# Patient Record
Sex: Female | Born: 1970 | Hispanic: No | Marital: Married | Smoking: Never smoker
Health system: Southern US, Community
[De-identification: ages and names within clinical notes are randomized; demographics above are authoritative.]

## PROBLEM LIST (undated history)

## (undated) DIAGNOSIS — F1910 Other psychoactive substance abuse, uncomplicated: Secondary | ICD-10-CM

## (undated) DIAGNOSIS — G4700 Insomnia, unspecified: Secondary | ICD-10-CM

## (undated) DIAGNOSIS — F419 Anxiety disorder, unspecified: Secondary | ICD-10-CM

## (undated) DIAGNOSIS — N943 Premenstrual tension syndrome: Secondary | ICD-10-CM

## (undated) DIAGNOSIS — K219 Gastro-esophageal reflux disease without esophagitis: Secondary | ICD-10-CM

## (undated) DIAGNOSIS — F329 Major depressive disorder, single episode, unspecified: Secondary | ICD-10-CM

## (undated) DIAGNOSIS — G43909 Migraine, unspecified, not intractable, without status migrainosus: Secondary | ICD-10-CM

## (undated) HISTORY — DX: Insomnia, unspecified: G47.00

## (undated) HISTORY — PX: BREAST SURGERY: SHX581

## (undated) HISTORY — DX: Gastro-esophageal reflux disease without esophagitis: K21.9

## (undated) HISTORY — DX: Anxiety disorder, unspecified: F41.9

## (undated) HISTORY — DX: Other psychoactive substance abuse, uncomplicated: F19.10

## (undated) HISTORY — DX: Migraine, unspecified, not intractable, without status migrainosus: G43.909

## (undated) HISTORY — DX: Premenstrual tension syndrome: N94.3

## (undated) HISTORY — DX: Major depressive disorder, single episode, unspecified: F32.9

---

## 2004-02-28 ENCOUNTER — Ambulatory Visit: Payer: Self-pay | Admitting: Internal Medicine

## 2004-03-20 ENCOUNTER — Ambulatory Visit: Payer: Self-pay | Admitting: Internal Medicine

## 2004-04-06 ENCOUNTER — Ambulatory Visit: Payer: Self-pay | Admitting: Family Medicine

## 2004-06-27 ENCOUNTER — Ambulatory Visit: Payer: Self-pay | Admitting: Internal Medicine

## 2005-09-05 ENCOUNTER — Ambulatory Visit: Payer: Self-pay | Admitting: Internal Medicine

## 2005-09-11 ENCOUNTER — Ambulatory Visit: Payer: Self-pay | Admitting: Internal Medicine

## 2005-10-29 ENCOUNTER — Ambulatory Visit: Payer: Self-pay | Admitting: Internal Medicine

## 2006-05-30 ENCOUNTER — Ambulatory Visit: Payer: Self-pay | Admitting: Internal Medicine

## 2006-07-31 ENCOUNTER — Other Ambulatory Visit: Admission: RE | Admit: 2006-07-31 | Discharge: 2006-07-31 | Payer: Self-pay | Admitting: Obstetrics & Gynecology

## 2006-11-20 DIAGNOSIS — J309 Allergic rhinitis, unspecified: Secondary | ICD-10-CM | POA: Insufficient documentation

## 2006-11-20 DIAGNOSIS — G4700 Insomnia, unspecified: Secondary | ICD-10-CM | POA: Insufficient documentation

## 2007-01-30 ENCOUNTER — Telehealth: Payer: Self-pay | Admitting: Internal Medicine

## 2007-07-27 ENCOUNTER — Telehealth: Payer: Self-pay | Admitting: Internal Medicine

## 2007-08-11 ENCOUNTER — Ambulatory Visit: Payer: Self-pay | Admitting: Internal Medicine

## 2007-08-11 LAB — CONVERTED CEMR LAB
AST: 19 units/L (ref 0–37)
Albumin: 3.9 g/dL (ref 3.5–5.2)
Alkaline Phosphatase: 32 units/L — ABNORMAL LOW (ref 39–117)
BUN: 8 mg/dL (ref 6–23)
Bilirubin Urine: NEGATIVE
Blood in Urine, dipstick: NEGATIVE
Eosinophils Relative: 7.6 % — ABNORMAL HIGH (ref 0.0–5.0)
GFR calc Af Amer: 105 mL/min
Glucose, Bld: 85 mg/dL (ref 70–99)
Glucose, Urine, Semiquant: NEGATIVE
HCT: 37.3 % (ref 36.0–46.0)
HDL: 69.1 mg/dL (ref >39.0)
Hemoglobin: 12.7 g/dL (ref 12.0–15.0)
Ketones, urine, test strip: NEGATIVE
Monocytes Absolute: 0.3 10*3/uL (ref 0.1–1.0)
Monocytes Relative: 7.4 % (ref 3.0–12.0)
Platelets: 234 10*3/uL (ref 150–400)
Potassium: 5 meq/L (ref 3.5–5.1)
Protein, U semiquant: NEGATIVE
RBC: 4.02 M/uL (ref 3.87–5.11)
Specific Gravity, Urine: 1.01
Total CHOL/HDL Ratio: 2.8
Total Protein: 6.5 g/dL (ref 6.0–8.3)
Urobilinogen, UA: 0.2
WBC: 4.3 10*3/uL — ABNORMAL LOW (ref 4.5–10.5)

## 2007-08-18 ENCOUNTER — Ambulatory Visit: Payer: Self-pay | Admitting: Internal Medicine

## 2007-08-18 DIAGNOSIS — N943 Premenstrual tension syndrome: Secondary | ICD-10-CM | POA: Insufficient documentation

## 2007-09-16 ENCOUNTER — Other Ambulatory Visit: Admission: RE | Admit: 2007-09-16 | Discharge: 2007-09-16 | Payer: Self-pay | Admitting: Obstetrics & Gynecology

## 2008-04-20 ENCOUNTER — Telehealth: Payer: Self-pay | Admitting: Internal Medicine

## 2008-06-14 ENCOUNTER — Telehealth: Payer: Self-pay | Admitting: Internal Medicine

## 2008-08-30 ENCOUNTER — Telehealth: Payer: Self-pay | Admitting: Internal Medicine

## 2008-11-14 ENCOUNTER — Ambulatory Visit: Payer: Self-pay | Admitting: Internal Medicine

## 2008-11-14 DIAGNOSIS — J029 Acute pharyngitis, unspecified: Secondary | ICD-10-CM

## 2008-11-15 ENCOUNTER — Encounter: Payer: Self-pay | Admitting: Internal Medicine

## 2009-04-11 ENCOUNTER — Ambulatory Visit: Payer: Self-pay | Admitting: Internal Medicine

## 2009-04-11 LAB — CONVERTED CEMR LAB
Albumin: 4.4 g/dL (ref 3.5–5.2)
BUN: 8 mg/dL (ref 6–23)
Basophils Relative: 1.1 % (ref 0.0–3.0)
Bilirubin Urine: NEGATIVE
Calcium: 9.3 mg/dL (ref 8.4–10.5)
Creatinine, Ser: 0.8 mg/dL (ref 0.4–1.2)
Direct LDL: 118.4 mg/dL
Eosinophils Absolute: 0.4 10*3/uL (ref 0.0–0.7)
GFR calc non Af Amer: 85.92 mL/min (ref >60)
Glucose, Bld: 84 mg/dL (ref 70–99)
HDL: 75 mg/dL (ref >39.00)
Lymphs Abs: 1.4 10*3/uL (ref 0.7–4.0)
MCHC: 33.7 g/dL (ref 30.0–36.0)
MCV: 95.6 fL (ref 78.0–100.0)
Monocytes Absolute: 0.4 10*3/uL (ref 0.1–1.0)
Neutrophils Relative %: 54 % (ref 43.0–77.0)
Platelets: 240 10*3/uL (ref 150.0–400.0)
TSH: 0.41 microintl units/mL (ref 0.35–5.50)
Triglycerides: 66 mg/dL (ref 0.0–149.0)
Urine Glucose: NEGATIVE mg/dL
Urobilinogen, UA: 0.2 (ref 0.0–1.0)

## 2009-04-25 ENCOUNTER — Ambulatory Visit: Payer: Self-pay | Admitting: Internal Medicine

## 2009-05-03 ENCOUNTER — Ambulatory Visit: Payer: Self-pay | Admitting: Internal Medicine

## 2009-05-03 DIAGNOSIS — J019 Acute sinusitis, unspecified: Secondary | ICD-10-CM

## 2009-05-03 DIAGNOSIS — J069 Acute upper respiratory infection, unspecified: Secondary | ICD-10-CM | POA: Insufficient documentation

## 2009-06-09 ENCOUNTER — Telehealth: Payer: Self-pay | Admitting: *Deleted

## 2009-06-27 ENCOUNTER — Telehealth: Payer: Self-pay | Admitting: *Deleted

## 2009-07-06 ENCOUNTER — Ambulatory Visit: Payer: Self-pay | Admitting: Internal Medicine

## 2009-07-06 DIAGNOSIS — F4323 Adjustment disorder with mixed anxiety and depressed mood: Secondary | ICD-10-CM

## 2010-05-22 NOTE — Progress Notes (Signed)
Summary: change from lexapro to paxil  Phone Note Call from Patient Call back at Work Phone [phone]   Caller: Patient Summary of Call: Pt called saying that lexapro is not working and would like to try paxil. Pt states that she is having some anxiety and depression isssues on lexapro. Pt does have an appt on 3/17. But would like to try paxil before then. Initial call taken by: Romualdo Bolk, CMA (AAMA),  June 27, 2009 11:25 AM  Follow-up for Phone Call        paxil 20 mg 1 by mouth once daily  and increase to 2 per day as needed.   disp 60   keep follow up appt.  Follow-up by: Madelin Headings MD,  June 27, 2009 12:59 PM  Additional Follow-up for Phone Call Additional follow up Details #1::        Left message on machine about rx being called in and to keep follow up appt on 3/17. Additional Follow-up by: Romualdo Bolk, CMA (AAMA),  June 27, 2009 1:22 PM    New/Updated Medications: PAROXETINE HCL 20 MG TABS (PAROXETINE HCL) 1 by mouth once daily and can increase to 2 by mouth once daily as needed. Prescriptions: PAROXETINE HCL 20 MG TABS (PAROXETINE HCL) 1 by mouth once daily and can increase to 2 by mouth once daily as needed.  #60 x 0   Entered by:   Romualdo Bolk, CMA (AAMA)   Authorized by:   Madelin Headings MD   Signed by:   Romualdo Bolk, CMA (AAMA) on 06/27/2009   Method used:   Electronically to        [facility]. [phone]* (retail)       [address]       [place], [place]  [address]       Ph: [phone]       Fax: [phone]   RxID:   [id number]

## 2010-05-22 NOTE — Assessment & Plan Note (Signed)
Summary: SITUATIONAL DEPRESSION / CONCERNS W/ MEDS // RS   Vital Signs:  Patient profile:   40 year old female Menstrual status:  regular Weight:      127 pounds Temp:     98.5 degrees F oral BP sitting:   110 / 70  Vitals Entered By: Lynann Beaver CMA (July 06, 2009 9:47 AM) CC: rov regarding meds Is Patient Diabetic? No Pain Assessment Patient in pain? no        History of Present Illness: Sharon Mercado  comes  in as directed after starting  paxil for reactive depressiona dnanxiety  related to may losses and illnesses.Gm dying , friend    and brother drug addiction.  Paxil     sleep is somewhat  better    no chest pain   worked up to 10 mg   for a few day.   Sleep  ok last night. NO tusing ambien right now   [employer].    no suicidal just sad and overwhelmed at times  and hard to concentrate. and anxious.  doing better already and no sig se so far. Tried to take her pms   med lexapro  at first  but caused  a  wired feeling.  So called as above.   Preventive Screening-Counseling & Management  Alcohol-Tobacco     Smoking Status: never  Caffeine-Diet-Exercise     Caffeine use/day: 1     Does Patient Exercise: yes  Current Medications (verified): 1)  Valtrex 1 Gm Tabs (Valacyclovir Hcl) .... 2 By Mouth Two Times A Day 2)  Ativan 0.5 Mg Tabs (Lorazepam) .Marland Kitchen.. 1-2 By Mouth Two Times A Day As Needed 3)  Zolpidem Tartrate 10 Mg Tabs (Zolpidem Tartrate) .Marland Kitchen.. 1 By Mouth At Bedtime 4)  Paroxetine Hcl 20 Mg Tabs (Paroxetine Hcl) .Marland Kitchen.. 1 By Mouth Once Daily and Can Increase To 2 By Mouth Once Daily As Needed.  Allergies (verified): No Known Drug Allergies  Past History:  Past medical, surgical, family and social histories (including risk factors) reviewed, and no changes noted (except as noted below).  Past Medical History: Reviewed history from 08/18/2007 and no changes required. Allergic rhinitis childbirth x 3   Past Surgical History: Reviewed history from 04/25/2009  and no changes required. Childbirth x 3  Family History: Reviewed history from 04/25/2009 and no changes required. Family History of Alcoholism/Addiction Family History Depression Family History High cholesterol Family History Hypertension Family History Other cancer-Breast Family History of Cardiovascular disorder Fam hx Stroke   Social History: Reviewed history from 04/25/2009 and no changes required. 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    Physical Exam  General:  alert, well-developed, well-nourished, and well-hydrated.   Psych:  Oriented X3, memory intact for recent and remote, normally interactive, good eye contact, not depressed appearing, and slightly anxious.     Impression & Recommendations:  Problem # 1:  ADJ DISORDER WITH MIXED ANXIETY & DEPRESSED MOOD (ICD-309.28) Assessment New seems so far to have an initial response to med     .  counseled about plan and  slow increase in dose .    taking  10 mg for now   Problem # 2:  INSOMNIA, IDIOPATHIC (ICD-780.52) Assessment: Unchanged only taking ocassional Her updated medication list for this problem includes:    Zolpidem Tartrate 10 Mg Tabs (Zolpidem tartrate) .Marland Kitchen... 1 by mouth at bedtime  Problem # 3:  ? se of daily lexapro jitteriness  Complete Medication List: 1)  Valtrex 1 Gm Tabs (Valacyclovir hcl) .... 2 by mouth two times a day 2)  Ativan 0.5 Mg Tabs (Lorazepam) .Marland Kitchen.. 1-2 by mouth two times a day as needed 3)  Zolpidem Tartrate 10 Mg Tabs (Zolpidem tartrate) .Marland Kitchen.. 1 by mouth at bedtime 4)  Paroxetine Hcl 20 Mg Tabs (Paroxetine hcl) .Marland Kitchen.. 1 by mouth once daily and can increase  to 2 by mouth once daily as needed.  Patient Instructions: 1)  Ok to stay on 10 mg  for at least another week. 2)  and then could   increase to 20 mg . 3)  return office visit in 6 weeks or as needed .

## 2010-05-22 NOTE — Progress Notes (Signed)
Summary: restart lexapro and something for flying  Phone Note Call from Patient Call back at Home Phone [phone]   Caller: Patient Summary of Call: Pt wants to restart lexapro and needs a refill on this plus something for flying.  Initial call taken by: Romualdo Bolk, CMA (AAMA),  June 09, 2009 1:30 PM  Follow-up for Phone Call        refill lexapro 20 mg  #30 refill x 2    but restart at 10 mg per day and increase after a few weeks. Ok to refill her ativan rx.    Call of ov   in 3-4 weeks or as needed.  about how she is doing. Follow-up by: Madelin Headings MD,  June 09, 2009 1:40 PM  Additional Follow-up for Phone Call Additional follow up Details #1::        Left message on machine that rx was called in and to call back in 3-4 weeks to let us know how she is doing on lexapro. Rx's called in. Additional Follow-up by: Romualdo Bolk, CMA (AAMA),  June 09, 2009 2:09 PM    New/Updated Medications: LEXAPRO 20 MG TABS (ESCITALOPRAM OXALATE) 1/2 by mouth once daily and can increase to 1 by mouth once daily in a few weeks Prescriptions: ATIVAN 0.5 MG TABS (LORAZEPAM) 1-2 by mouth two times a day as needed  #15 x 0   Entered by:   Romualdo Bolk, CMA (AAMA)   Authorized by:   Madelin Headings MD   Signed by:   Romualdo Bolk, CMA (AAMA) on 06/09/2009   Method used:   Telephoned to ...       [facility]  [place]. [phone]* (retail)       [address]       [place], [place]  [address]       Ph: [phone]       Fax: [phone]   RxID:   [id number] LEXAPRO 20 MG TABS (ESCITALOPRAM OXALATE) 1/2 by mouth once daily and can increase to 1 by mouth once daily in a few weeks  #30 x 2   Entered by:   Romualdo Bolk, CMA (AAMA)   Authorized by:   Madelin Headings MD   Signed by:   Romualdo Bolk, CMA (AAMA) on 06/09/2009   Method used:   Electronically to        [facility]. [phone]* (retail)       [address]       [place], [place]  [address]       Ph: [phone]       Fax: [phone]   RxID:   [id number]

## 2010-05-22 NOTE — Assessment & Plan Note (Signed)
Summary: CPX/NO PAP/CJR/PT RSC/CJR   Vital Signs:  Patient profile:   40 year old female Menstrual status:  regular LMP:     03/28/2009 Height:      68.25 inches Weight:      130 pounds BMI:     19.69 Pulse rate:   60 / minute BP sitting:   100 / 70  (left arm) Cuff size:   regular  Vitals Entered By: Romualdo Bolk, CMA (AAMA) (April 25, 2009 10:20 AM) CC: CPX no pap- Pt has a gyn who does paps LMP (date): 03/28/2009 LMP - Character: light Menarche (age onset years): 16   Menses interval (days): 28 Menstrual flow (days): 3-4 Enter LMP: 03/28/2009 Last PAP Result normal   History of Present Illness: [name] comesin for  preventive visit . Also refill of med she uses 1/2 to 1 of ambien   in premenstral time when sleep is very affected and then does a lit better.  this seems much better than daily ssri or daily med. doing well and no new medical problems. back better with yoga.  had labs done . sees gyne for  paps etc. no change in fam hx .       Preventive Care Screening  Pap Smear:    Date:  07/22/2007    Results:  normal    Preventive Screening-Counseling & Management  Alcohol-Tobacco     Alcohol drinks/day: <1     Alcohol type: wine     Smoking Status: never  Caffeine-Diet-Exercise     Caffeine use/day: 1     Does Patient Exercise: yes  Hep-HIV-STD-Contraception     Dental Visit-last 6 months yes     Sun Exposure-Excessive: no  Safety-Violence-Falls     Seat Belt Use: yes     Firearms in the Home: no firearms in the home     Smoke Detectors: yes      Blood Transfusions:  no.        Travel History:  [place]    Current Medications (verified): 1)  Valtrex 1 Gm Tabs (Valacyclovir Hcl) .... 2 By Mouth Two Times A Day 2)  Ativan 0.5 Mg Tabs (Lorazepam) .Marland Kitchen.. 1-2 By Mouth Two Times A Day As Needed 3)  Zolpidem Tartrate 10 Mg Tabs (Zolpidem Tartrate) .Marland Kitchen.. 1 By Mouth At Bedtime  Allergies (verified): No Known Drug  Allergies  Past History:  Past medical, surgical, family and social histories (including risk factors) reviewed, and no changes noted (except as noted below).  Past Medical History: Reviewed history from 08/18/2007 and no changes required. Allergic rhinitis childbirth x 3   Past Surgical History: Childbirth x 3  Family History: Reviewed history from 11/20/2006 and no changes required. Family History of Alcoholism/Addiction Family History Depression Family History High cholesterol Family History Hypertension Family History Other cancer-Breast Family History of Cardiovascular disorder Fam hx Stroke   Social History: Reviewed history from 11/14/2008 and no changes required. 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 Smoked Alcohol use-yes 3 night a week 1-2  Drug use-no Regular exercise-yes hhof 5  and dog  10,8 and 6   yrs    children    Seat Belt Use:  yes Dental Care w/in 6 mos.:  yes Sun Exposure-Excessive:  no Blood Transfusions:  no  Review of Systems  The patient denies anorexia, fever, weight loss, weight gain, vision loss, decreased hearing, hoarseness, chest pain, syncope, dyspnea on exertion, peripheral edema, prolonged cough, headaches, hemoptysis, abdominal  pain, melena, hematochezia, severe indigestion/heartburn, hematuria, incontinence, muscle weakness, suspicious skin lesions, transient blindness, difficulty walking, depression, unusual weight change, abnormal bleeding, enlarged lymph nodes, angioedema, and breast masses.         back pain is better with yoga  exercises.   allergies stable Physical Exam General Appearance: well developed, well nourished, no acute distress Eyes: conjunctiva and lids normal, PERRLA, EOMI,  WNL Ears, Nose, Mouth, Throat: TM clear, nares clear, oral exam WNL Neck: supple, no lymphadenopathy, no thyromegaly, no JVD Respiratory: clear to auscultation and percussion, respiratory effort normal Cardiovascular: regular rate and  rhythm, S1-S2, no murmur, rub or gallop, no bruits, peripheral pulses normal and symmetric, no cyanosis, clubbing, edema or varicosities Chest: no scars, masses, tenderness; no asymmetry, skin changes, nipple discharge   Gastrointestinal: soft, non-tender; no hepatosplenomegaly, masses; active bowel sounds all quadrants,  Genitourinary: per gyne Lymphatic: no cervical, axillary or inguinal adenopathy Musculoskeletal: gait normal, muscle tone and strength WNL, no joint swelling, effusions, discoloration, crepitus  Skin: clear, good turgor, color WNL, no rashes, lesions, or ulcerations Neurologic: normal mental status, normal reflexes, normal strength, sensation, and motion Psychiatric: alert; oriented to person, place and time Other Exam:  see labs      Impression & Recommendations:  Problem # 1:  PREVENTIVE HEALTH CARE (ICD-V70.0) continue healthy lifestyle .   Problem # 2:  INSOMNIA, IDIOPATHIC (ICD-780.52) seems to be pms related and controlled with as needed ambien.   ok to continue Discussed risk benefit  discussed.  Her updated medication list for this problem includes:    Zolpidem Tartrate 10 Mg Tabs (Zolpidem tartrate) .Marland Kitchen... 1 by mouth at bedtime  Complete Medication List: 1)  Valtrex 1 Gm Tabs (Valacyclovir hcl) .... 2 by mouth two times a day 2)  Ativan 0.5 Mg Tabs (Lorazepam) .Marland Kitchen.. 1-2 by mouth two times a day as needed 3)  Zolpidem Tartrate 10 Mg Tabs (Zolpidem tartrate) .Marland Kitchen.. 1 by mouth at bedtime  Other Orders: Admin 1st Vaccine (56213) Flu Vaccine 44yrs + (08657)  Patient Instructions: 1)  continue healthy lifestyle  2)  rov 1 year or as needed. 3)  can do pv every other year or so because labs are good. Prescriptions: ZOLPIDEM TARTRATE 10 MG TABS (ZOLPIDEM TARTRATE) 1 by mouth at bedtime  #30 x 1   Entered and Authorized by:   Madelin Headings MD   Signed by:   Madelin Headings MD on 04/25/2009   Method used:   Print then Give to Patient   RxID:    [id number]    Flu Vaccine Consent Questions     Do you have a history of severe allergic reactions to this vaccine? no    Any prior history of allergic reactions to egg and/or gelatin? no    Do you have a sensitivity to the preservative Thimersol? no    Do you have a past history of Guillan-Barre Syndrome? no    Do you currently have an acute febrile illness? no    Have you ever had a severe reaction to latex? no    Vaccine information given and explained to patient? yes    Are you currently pregnant? no    Lot Number:[id number]   Exp Date:10/19/2009   Site Given  Left Deltoid IMlu Romualdo Bolk, CMA (AAMA)  April 25, 2009 10:23 AM

## 2010-05-22 NOTE — Assessment & Plan Note (Signed)
Summary: sinuses//ccm   Vital Signs:  Patient profile:   40 year old female Menstrual status:  regular Height:      68.25 inches Weight:      130 pounds Temp:     97.8 degrees F oral Pulse rate:   78 / minute BP sitting:   120 / 80  (right arm) Cuff size:   regular  Vitals Entered By: Romualdo Bolk, CMA (AAMA) (May 03, 2009 2:56 PM) CC: Sinus pressure, coughing and congestion that started on 1/5   History of Present Illness: Marguriete Wootan comes in for acute visit  . began day of cpx seen last and persistent and progressive  with thick green discharge and headache and ears popping .  No fever but feels flu like. Marland Kitchen No rash. Used mucinex  .    Has mild cough .   Preventive Screening-Counseling & Management  Alcohol-Tobacco     Alcohol drinks/day: <1     Alcohol type: wine     Smoking Status: never  Caffeine-Diet-Exercise     Caffeine use/day: 1     Does Patient Exercise: yes  Current Medications (verified): 1)  Valtrex 1 Gm Tabs (Valacyclovir Hcl) .... 2 By Mouth Two Times A Day 2)  Ativan 0.5 Mg Tabs (Lorazepam) .Marland Kitchen.. 1-2 By Mouth Two Times A Day As Needed 3)  Zolpidem Tartrate 10 Mg Tabs (Zolpidem Tartrate) .Marland Kitchen.. 1 By Mouth At Bedtime  Allergies (verified): No Known Drug Allergies  Past History:  Past medical, surgical, family and social histories (including risk factors) reviewed for relevance to current acute and chronic problems.  Past Medical History: Reviewed history from 08/18/2007 and no changes required. Allergic rhinitis childbirth x 3   Past Surgical History: Reviewed history from 04/25/2009 and no changes required. Childbirth x 3  Family History: Reviewed history from 04/25/2009 and no changes required. Family History of Alcoholism/Addiction Family History Depression Family History High cholesterol Family History Hypertension Family History Other cancer-Breast Family History of Cardiovascular disorder Fam hx Stroke   Social  History: Reviewed history from 04/25/2009 and no changes required. 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  The patient denies anorexia, fever, weight loss, weight gain, vision loss, chest pain, syncope, dyspnea on exertion, prolonged cough, and hemoptysis.         ears clogged and popping.  Physical Exam  General:  Well-developed,well-nourished,in no acute distress; alert,appropriate and cooperative throughout examination congested  Head:  normocephalic and atraumatic.   Eyes:  vision grossly intact, pupils equal, and pupils round.   Ears:  R ear normal  old small tympanosxlerosisand L ear normal.   Nose:  no external deformity and no external erythema.  3+ congested  tener zygomal area bilaterally no edema Mouth:  Oral mucosa and oropharynx without lesions or exudates.  Teeth in good repair. Neck:  No deformities, masses, or tenderness noted. Lungs:  normal respiratory effort and no intercostal retractions.   Skin:  turgor normal and color normal.   Cervical Nodes:  no anterior cervical adenopathy and no posterior cervical adenopathy.     Impression & Recommendations:  Problem # 1:  SINUSITIS - ACUTE-NOS (ICD-461.9)  disc about  Expectant management and rx   can  add antibiotic if persistent and progressive   Her updated medication list for this problem includes:    Amoxicillin 875 Mg Tabs (Amoxicillin) .Marland Kitchen... 1  by mouth three times a day for sinusitis  Instructed on treatment. Call if symptoms persist or worsen.   Problem # 2:  URI (ICD-465.9)  Complete Medication List: 1)  Valtrex 1 Gm Tabs (Valacyclovir hcl) .... 2 by mouth two times a day 2)  Ativan 0.5 Mg Tabs (Lorazepam) .Marland Kitchen.. 1-2 by mouth two times a day as needed 3)  Zolpidem Tartrate 10 Mg Tabs (Zolpidem tartrate) .Marland Kitchen..  1 by mouth at bedtime 4)  Amoxicillin 875 Mg Tabs (Amoxicillin) .Marland Kitchen.. 1  by mouth three times a day for sinusitis  Patient Instructions: 1)  Acute sinusitis symptoms for less than 10 days are not helped by antibiotics. Use warm moist compresses, and over the counter decongestants( only as directed). Call if no improvement in 5-7 days, sooner if increasing pain, fever, or new symptoms.  2)  saline nose washes   Prescriptions: AMOXICILLIN 875 MG TABS (AMOXICILLIN) 1  by mouth three times a day for sinusitis  #30 x 0   Entered and Authorized by:   Madelin Headings MD   Signed by:   Madelin Headings MD on 05/03/2009   Method used:   Electronically to        [facility]. [phone]* (retail)       [address]       [place], [place]  [address]       Ph: [phone]       Fax: [phone]   RxID:   [id number]

## 2010-06-08 ENCOUNTER — Ambulatory Visit (INDEPENDENT_AMBULATORY_CARE_PROVIDER_SITE_OTHER): Payer: PRIVATE HEALTH INSURANCE | Admitting: Internal Medicine

## 2010-06-08 ENCOUNTER — Encounter: Payer: Self-pay | Admitting: Internal Medicine

## 2010-06-08 VITALS — BP 102/60 | HR 106 | Temp 98.2°F | Resp 12 | Wt 128.0 lb

## 2010-06-08 DIAGNOSIS — N943 Premenstrual tension syndrome: Secondary | ICD-10-CM

## 2010-06-08 DIAGNOSIS — K469 Unspecified abdominal hernia without obstruction or gangrene: Secondary | ICD-10-CM

## 2010-06-08 DIAGNOSIS — G4700 Insomnia, unspecified: Secondary | ICD-10-CM

## 2010-06-08 MED ORDER — FLUOXETINE HCL 10 MG PO TABS: ORAL_TABLET | ORAL | Status: DC

## 2010-06-08 NOTE — Assessment & Plan Note (Signed)
Okay to remain on the Ambien for now future goals  to eventually weaned down.

## 2010-06-08 NOTE — Assessment & Plan Note (Signed)
Options discussed will try low-dose fluoxetine over the next few months cyclically. She will call meantime if there is a situation that she needs help with.

## 2010-06-08 NOTE — Progress Notes (Signed)
  Subjective:    Patient ID: Sharon Mercado, female    DOB: 08/27/1970, 40 y.o.   MRN: [id number]  HPI PMS helps a lot   But  Energizes and intereferes with her  sleep.     Taking   ambien to help.     Would like to explore other options for the  Pms.  Had se of paxil and citalopram not as helpful.   Is optimized lifestyle interventions  NO other sig change in health hx .  Busy at home with children and family.  Also recently  having swellking  In upper abdomen .  Non tender ? If a hernia Is  Very physically active no change in level. No NV Gi or Gu sx  Sleep:  As above   Needing to take ambien when on  Lexapro.   Past Medical History  Diagnosis Date  . Depression   . PMS (premenstrual syndrome)   . Insomnia    History reviewed. No pertinent past surgical history.  reports that she has never smoked. She has never used smokeless tobacco. Her alcohol and drug histories not on file. family history is not on file. Childbirth      Review of Systems  negative chest pain shortness of breath weight change. Rest as per history of present illness    Objective:   Physical Exam  well-developed well-nourished in no acute distress. Normocephalic atraumatic neck supple without masses or thyromegaly cardiac S1-S2 no gallops or murmurs abdomen soft without organomegaly guarding or rebound no masses noted. There is about a 3 inch midline soft, roundish nontender reducible swelling when she stands about 3 or 4 inches above the umbilicus. This resolves in the supine position. There are no masses noted.  Oriented x 3. Normal cognition, attention, speech. Minimally  anxious and not depressed  depressed appearing   Good eye contact .       Assessment & Plan:  PMS with se of med Insomnia  Supra umbi hernia  Small and no sx      Expectant management.

## 2010-06-08 NOTE — Patient Instructions (Signed)
Change   lexapro to prozac as dicussed.  If get pain or increase in  Hernia area call for a consult.  return office visit  In about 3 months to see how things are going.

## 2010-06-08 NOTE — Assessment & Plan Note (Signed)
Discussed options we will do observation at present it becomes bigger or painful we will get a surgical consult.

## 2010-06-09 ENCOUNTER — Encounter: Payer: Self-pay | Admitting: Internal Medicine

## 2010-06-18 ENCOUNTER — Telehealth: Payer: Self-pay | Admitting: Internal Medicine

## 2010-06-18 MED ORDER — SERTRALINE HCL 25 MG PO TABS: ORAL_TABLET | ORAL | Status: DC

## 2010-06-18 NOTE — Telephone Encounter (Signed)
Pt saw doc last wk was prescribed fluoxetine for pms she had activation. Pt would like to try sertraline call into [facility] [phone]

## 2010-06-18 NOTE — Telephone Encounter (Signed)
Okay to prescribe generic Zoloft 25 mg to take half to one by mouth for PMS. Dispense 30 refill x1

## 2010-07-02 ENCOUNTER — Telehealth: Payer: Self-pay | Admitting: Internal Medicine

## 2010-07-02 NOTE — Telephone Encounter (Signed)
Pt would like to increase sertraline 25 mg to  50mg  #30 call into [facility] [phone]

## 2010-07-03 MED ORDER — SERTRALINE HCL 50 MG PO TABS: 50.0000 mg | ORAL_TABLET | Freq: Every day | ORAL | Status: DC

## 2010-07-03 NOTE — Telephone Encounter (Signed)
rx sent to pharmacy

## 2010-07-03 NOTE — Telephone Encounter (Signed)
Please rx sertraline 50 1 po qd disp 30 refill x 2

## 2010-09-07 ENCOUNTER — Ambulatory Visit: Payer: PRIVATE HEALTH INSURANCE | Admitting: Internal Medicine

## 2010-09-27 ENCOUNTER — Telehealth: Payer: Self-pay | Admitting: *Deleted

## 2010-09-27 NOTE — Telephone Encounter (Signed)
Pt is wanting to increase her zoloft to 100mg . Is this okay to call in?

## 2010-09-28 NOTE — Telephone Encounter (Signed)
Ok to increase to 150 mg per day. Please call in increase of doseing med 1.5 per day  75f zoloft 100 mg  Refill x 1 and then rov or call in 3-4 weeks.

## 2010-09-30 ENCOUNTER — Other Ambulatory Visit: Payer: Self-pay | Admitting: Internal Medicine

## 2010-10-01 MED ORDER — SERTRALINE HCL 100 MG PO TABS: 100.0000 mg | ORAL_TABLET | Freq: Every day | ORAL | Status: DC

## 2010-10-01 NOTE — Telephone Encounter (Signed)
Rx sent to pharmacy. Left message on machine about this.

## 2010-10-29 ENCOUNTER — Encounter: Payer: Self-pay | Admitting: Internal Medicine

## 2010-10-29 ENCOUNTER — Ambulatory Visit (INDEPENDENT_AMBULATORY_CARE_PROVIDER_SITE_OTHER): Payer: PRIVATE HEALTH INSURANCE | Admitting: Internal Medicine

## 2010-10-29 VITALS — BP 100/60 | HR 60 | Wt 131.0 lb

## 2010-10-29 DIAGNOSIS — G4700 Insomnia, unspecified: Secondary | ICD-10-CM

## 2010-10-29 DIAGNOSIS — N943 Premenstrual tension syndrome: Secondary | ICD-10-CM

## 2010-10-29 DIAGNOSIS — F4323 Adjustment disorder with mixed anxiety and depressed mood: Secondary | ICD-10-CM

## 2010-10-29 NOTE — Progress Notes (Signed)
  Subjective:    Patient ID: Sharon Mercado, female    DOB: [birth year], 40 y.o.   MRN: [id number]  HPI Patient comes in today For fu of med management.  Using  meds for for pms but  For summer  When others are home   Taking  Daily meds.  25 mg per day of zoloft . And helps  Take the edge off. Got PAP from [facility] and adressed poss  hormonal issues. [facility]  rec lo dose pill .       Lo estrin  24   On 2-3 pack.  Helping some with sleep.  Sleep: AMbien  Helps  With travel  And then pms.     Children at home 11- 9- 5    ... Still teaching yoga.    Review of Systems Had optical migraines  On July 4ths.  ? FROM HEAT    Had mild headache.  Other wise no cp sob neuro issues   Past history family history social history reviewed in the electronic medical record.     Objective:   Physical Exam WDWN in nad Neck no masses or bruits Chest:  Clear to A&P without wheezes rales or rhonchi CV:  S1-S2 no gallops or murmurs peripheral perfusion is normal Abdomen:  Sof,t normal bowel sounds without hepatosplenomegaly, no guarding rebound or masses no CVA tenderness Oriented x 3 and no noted deficits in memory, attention, and speech. Psych  Alert good eye contact nl affect .and speech.     Assessment & Plan:  PMS Reactive mood  Stable on current med program  Using some meds prn and other daily.  Situational insomnia  Ok to refill and cpx with labs in 6 months and med check then.

## 2010-10-29 NOTE — Patient Instructions (Signed)
Continue same regimen  CPX with labs in 6 months

## 2010-10-31 ENCOUNTER — Other Ambulatory Visit: Payer: Self-pay | Admitting: Internal Medicine

## 2010-11-01 NOTE — Telephone Encounter (Signed)
Rx called in 

## 2010-11-01 NOTE — Telephone Encounter (Signed)
Ok x 3  

## 2010-11-01 NOTE — Telephone Encounter (Signed)
LOV 10/29/10 NOV 05/01/11

## 2010-11-04 ENCOUNTER — Encounter: Payer: Self-pay | Admitting: Internal Medicine

## 2011-02-15 ENCOUNTER — Telehealth: Payer: Self-pay | Admitting: *Deleted

## 2011-02-15 NOTE — Telephone Encounter (Signed)
Message on triage, Pt is traveling next week and she wants something for anxiety

## 2011-02-15 NOTE — Telephone Encounter (Signed)
Unsure what she used before but can use Lorazepam 0.5 mg  1-2 po tid prn anxiety.  Disp 20 #

## 2011-02-18 MED ORDER — LORAZEPAM 0.5 MG PO TABS: ORAL_TABLET | ORAL | Status: DC

## 2011-02-18 NOTE — Telephone Encounter (Signed)
  Rx called in and left message on machine for pt.

## 2011-03-05 ENCOUNTER — Telehealth: Payer: Self-pay | Admitting: Family Medicine

## 2011-03-05 DIAGNOSIS — Z0000 Encounter for general adult medical examination without abnormal findings: Secondary | ICD-10-CM

## 2011-03-05 NOTE — Telephone Encounter (Signed)
Orders placed in epic

## 2011-03-05 NOTE — Telephone Encounter (Signed)
This pt is on the [facility] lab schedule for 04/24/11. Can you please enter in the orders for the pt's CPX labs? Thanks!

## 2011-03-20 ENCOUNTER — Other Ambulatory Visit: Payer: Self-pay | Admitting: Internal Medicine

## 2011-04-24 ENCOUNTER — Other Ambulatory Visit: Payer: Self-pay | Admitting: Internal Medicine

## 2011-04-24 ENCOUNTER — Other Ambulatory Visit (INDEPENDENT_AMBULATORY_CARE_PROVIDER_SITE_OTHER): Payer: No Typology Code available for payment source

## 2011-04-24 ENCOUNTER — Other Ambulatory Visit: Payer: No Typology Code available for payment source

## 2011-04-24 DIAGNOSIS — Z0000 Encounter for general adult medical examination without abnormal findings: Secondary | ICD-10-CM

## 2011-04-24 LAB — CBC WITH DIFFERENTIAL/PLATELET
Eosinophils Relative: 8.7 % — ABNORMAL HIGH (ref 0.0–5.0)
HCT: 38.2 % (ref 36.0–46.0)
Hemoglobin: 13.1 g/dL (ref 12.0–15.0)
Lymphocytes Relative: 25.6 % (ref 12.0–46.0)
Lymphs Abs: 1.5 10*3/uL (ref 0.7–4.0)
Monocytes Relative: 7.7 % (ref 3.0–12.0)
Neutro Abs: 3.4 10*3/uL (ref 1.4–7.7)
RBC: 4.01 Mil/uL (ref 3.87–5.11)
WBC: 5.9 10*3/uL (ref 4.5–10.5)

## 2011-04-24 LAB — URINALYSIS
Hgb urine dipstick: NEGATIVE
Nitrite: NEGATIVE
Specific Gravity, Urine: 1.01 (ref 1.000–1.030)
Urine Glucose: NEGATIVE
Urobilinogen, UA: 0.2 (ref 0.0–1.0)

## 2011-04-24 LAB — LIPID PANEL
Cholesterol: 213 mg/dL — ABNORMAL HIGH (ref 0–200)
Total CHOL/HDL Ratio: 2

## 2011-04-24 LAB — BASIC METABOLIC PANEL
GFR: 118.45 mL/min (ref >60.00)
Potassium: 4.2 mEq/L (ref 3.5–5.1)
Sodium: 140 mEq/L (ref 135–145)

## 2011-04-24 LAB — HEPATIC FUNCTION PANEL
ALT: 22 U/L (ref 0–35)
AST: 21 U/L (ref 0–37)
Albumin: 4.1 g/dL (ref 3.5–5.2)
Alkaline Phosphatase: 37 U/L — ABNORMAL LOW (ref 39–117)

## 2011-04-24 LAB — LDL CHOLESTEROL, DIRECT: Direct LDL: 94.3 mg/dL

## 2011-05-01 ENCOUNTER — Encounter: Payer: Self-pay | Admitting: Internal Medicine

## 2011-05-01 ENCOUNTER — Ambulatory Visit (INDEPENDENT_AMBULATORY_CARE_PROVIDER_SITE_OTHER): Payer: No Typology Code available for payment source | Admitting: Internal Medicine

## 2011-05-01 VITALS — BP 100/70 | HR 60 | Ht 68.0 in | Wt 132.0 lb

## 2011-05-01 DIAGNOSIS — N943 Premenstrual tension syndrome: Secondary | ICD-10-CM

## 2011-05-01 DIAGNOSIS — G4700 Insomnia, unspecified: Secondary | ICD-10-CM

## 2011-05-01 DIAGNOSIS — J309 Allergic rhinitis, unspecified: Secondary | ICD-10-CM

## 2011-05-01 DIAGNOSIS — Z0000 Encounter for general adult medical examination without abnormal findings: Secondary | ICD-10-CM

## 2011-05-01 MED ORDER — TRAZODONE HCL 50 MG PO TABS: 25.0000 mg | ORAL_TABLET | Freq: Every evening | ORAL | Status: DC | PRN

## 2011-05-01 NOTE — Progress Notes (Signed)
Subjective:    Patient ID: Sharon Mercado, female    DOB: 10-14-1970, 41 y.o.   MRN: [id number]  HPI Patient comes in today for preventive visit and follow-up of medical issues. Update of her history since her last visit. She had her GYN check which was normal still having mood issues around her. PMS. Low-dose birth control pills were helpful however she had visual aura migraines and had to stop. Since that time her GYN has put her on hormonal therapy for the last month  Zoloft  25 helps some but agitation with higher dose.  Sleep : Remus Loffler  Still helps most of the time. Doesn't take it all the time.  He is aware of side effects  Lorazepam  Used for sleep for a trip  And not on a reg basis. Not taking it currently.    Mom was depressed.  No other changes in health no injuries.  Review of Systems ROS:  GEN/ HEENTNo fever, significant weight changes sweats headaches vision problems hearing changes, CV/ PULM; No chest pain shortness of breath cough, syncope,edema  change in exercise tolerance. GI /GU: No adominal pain, vomiting, change in bowel habits. No blood in the stool. No significant GU symptoms. SKIN/HEME: ,no acute skin rashes suspicious lesions or bleeding. No lymphadenopathy, nodules, masses.  NEURO/ PSYCH:  No neurologic signs such as weakness numbness . IMM/ Allergy: No unusual infections.  Allergy .  stable Check ln in neck REST of 12 system review negative    Past Medical History  Diagnosis Date  . Depression   . PMS (premenstrual syndrome)   . Insomnia   . Allergic rhinitis     History   Social History  . Marital Status: Unknown    Spouse Name: N/A    Number of Children: N/A  . Years of Education: N/A   Occupational History  . Not on file.   Social History Main Topics  . Smoking status: Never Smoker   . Smokeless tobacco: Never Used  . Alcohol Use: Not on file  . Drug Use: Not on file  . Sexually Active: Not on file   Other Topics Concern  . 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  Family History  Problem Relation Age of Onset  . Depression    . Hyperlipidemia    . Alcohol abuse    . Stroke    . Breast cancer      No Known Allergies  Current Outpatient Prescriptions on File Prior to Visit  Medication Sig Dispense Refill  . LORazepam (ATIVAN) 0.5 MG tablet take 1 to 2 tablets by mouth three times a day if needed for anxiety  20 tablet  1  . valACYclovir (VALTREX) 500 MG tablet Take 500 mg by mouth 2 (two) times daily. Prn       . zolpidem (AMBIEN) 10 MG tablet take 1 tablet by mouth at bedtime if needed  20 tablet  1    BP 100/70  Pulse 60  Ht 5\' 8"  (1.727 m)  Wt 132 lb (59.875 kg)  BMI 20.07 kg/m2  LMP 03/31/2011        Objective:   Physical Exam  Physical Exam: Vital signs reviewed NWG:NFAO is a well-developed well-nourished alert cooperative  white female  who appears her stated age in no acute distress.  HEENT: normocephalic atraumatic , Eyes: PERRL EOM's full, conjunctiva clear, Nares: paten,t no deformity discharge or tenderness., Ears: no deformity EAC's clear TMs with normal landmarks. Mouth: clear OP, no lesions, edema.  Moist mucous membranes. Dentition in adequate repair. NECK: supple without masses, thyromegaly or bruits. CHEST/PULM:  Clear to auscultation and percussion breath sounds equal no wheeze , rales or rhonchi. No chest wall deformities or tenderness. CV: PMI is nondisplaced, S1 S2 no gallops, murmurs, rubs. Peripheral pulses are full without delay.No JVD .  ABDOMEN: Bowel sounds normal nontender  No guard or rebound, no hepato splenomegal no CVA tenderness.  No hernia. Extremtities:  No clubbing cyanosis or edema, no acute joint swelling or redness no focal atrophy NEURO:  Oriented x3, cranial nerves 3-12 appear  to be intact, no obvious focal weakness,gait within normal limits no abnormal reflexes or asymmetrical SKIN: No acute rashes normal turgor, color, no bruising or petechiae. PSYCH: Oriented, good eye contact, no obvious depression anxiety, cognition and judgment appear normal. LN: no cervical axillary inguinal adenopathy there is a single small 1 cm shoddy node left neck mobile smooth    Lab Results  Component Value Date   WBC 5.9 04/24/2011   HGB 13.1 04/24/2011   HCT 38.2 04/24/2011   PLT 240.0 04/24/2011   GLUCOSE 92 04/24/2011   CHOL 213* 04/24/2011   TRIG 87.0 04/24/2011   HDL 91.40 04/24/2011   LDLDIRECT 94.3 04/24/2011   LDLCALC 110* 08/11/2007   ALT 22 04/24/2011   AST 21 04/24/2011   NA 140 04/24/2011   K 4.2 04/24/2011   CL 104 04/24/2011   CREATININE 0.6 04/24/2011   BUN 12 04/24/2011   CO2 31 04/24/2011   TSH 0.52 04/24/2011         Assessment & Plan:  Preventive Health Care Counseled regarding healthy nutrition, exercise, sleep, injury prevention, calcium vit d and healthy weight . LN reactive and not sic unless increasing   PMS   Continue problematic and visual migraine sx with ocps that helped.   IUD made things worse. Sleep problematic and pt aware of habit forming nature of meds.  Disc options to try   Will try trazadone for now 25 - 50 poss 75 and see how she does .  Otherwise stay on meds and rov in 6 months   Allergies stable

## 2011-05-01 NOTE — Patient Instructions (Signed)
Try low dose trazadone for sleep for a few week and can increase dose to 50 or 75 mg slowly . Call for refills  Otherwise Continue lifestyle intervention healthy eating and exercise . And rov in 6 months or so.

## 2011-05-03 ENCOUNTER — Other Ambulatory Visit: Payer: Self-pay | Admitting: Internal Medicine

## 2011-05-16 ENCOUNTER — Other Ambulatory Visit: Payer: Self-pay | Admitting: Internal Medicine

## 2011-05-20 NOTE — Telephone Encounter (Signed)
This message just got to my desk top until today. Ok to refill

## 2011-05-20 NOTE — Telephone Encounter (Signed)
Rx called in 

## 2011-05-27 ENCOUNTER — Telehealth: Payer: Self-pay | Admitting: *Deleted

## 2011-05-27 MED ORDER — TRAZODONE HCL 100 MG PO TABS: 100.0000 mg | ORAL_TABLET | Freq: Every day | ORAL | Status: DC

## 2011-05-27 NOTE — Telephone Encounter (Signed)
Recently saw Dr. Fabian Sharp and was prescribed trazodone 50mg  and was advised to call back if that dosage did not work.  Pt requesting to increase dosage to 100mg .  [facility].

## 2011-05-27 NOTE — Telephone Encounter (Signed)
Pls advise.  

## 2011-05-27 NOTE — Telephone Encounter (Signed)
Ok to increase to   Trazodone100 mg hs  Disp 30 refill x 3 1 po hs or as directed   Please send this in  thanks

## 2011-05-27 NOTE — Telephone Encounter (Signed)
Rx sent to pharmacy.  Left a message for pt to return call.

## 2011-06-05 ENCOUNTER — Other Ambulatory Visit: Payer: Self-pay | Admitting: Internal Medicine

## 2011-06-06 NOTE — Telephone Encounter (Signed)
Talk with patient  . Tell us how her sleep is doing   trazadone vs ambien  . So we can decide on how to do medication refill

## 2011-06-06 NOTE — Telephone Encounter (Signed)
Last refill was on 03/20/11 #20 with 1 refill

## 2011-06-06 NOTE — Telephone Encounter (Signed)
Left message to call back  

## 2011-06-07 NOTE — Telephone Encounter (Signed)
Per Dr. Panosh- ok x 1. Rx called in. 

## 2011-06-07 NOTE — Telephone Encounter (Signed)
Pt called back and said that the trazadone caused her to have weird side effects and didn't help her sleep. So she is weaning herself off it and is wanting to go back on [place].

## 2011-07-10 ENCOUNTER — Other Ambulatory Visit: Payer: Self-pay | Admitting: Internal Medicine

## 2011-07-11 ENCOUNTER — Other Ambulatory Visit: Payer: Self-pay | Admitting: Internal Medicine

## 2011-07-11 NOTE — Telephone Encounter (Signed)
rx called in

## 2011-07-11 NOTE — Telephone Encounter (Signed)
Ok x 1 of each.

## 2011-08-15 ENCOUNTER — Ambulatory Visit (INDEPENDENT_AMBULATORY_CARE_PROVIDER_SITE_OTHER): Payer: No Typology Code available for payment source | Admitting: Internal Medicine

## 2011-08-15 ENCOUNTER — Encounter: Payer: Self-pay | Admitting: Internal Medicine

## 2011-08-15 VITALS — BP 104/70 | HR 78 | Temp 98.2°F | Wt 130.0 lb

## 2011-08-15 DIAGNOSIS — T887XXA Unspecified adverse effect of drug or medicament, initial encounter: Secondary | ICD-10-CM

## 2011-08-15 DIAGNOSIS — F4323 Adjustment disorder with mixed anxiety and depressed mood: Secondary | ICD-10-CM

## 2011-08-15 DIAGNOSIS — G4700 Insomnia, unspecified: Secondary | ICD-10-CM

## 2011-08-15 DIAGNOSIS — N943 Premenstrual tension syndrome: Secondary | ICD-10-CM

## 2011-08-15 MED ORDER — DOXEPIN HCL 6 MG PO TABS: 1.0000 | ORAL_TABLET | Freq: Every evening | ORAL | Status: DC | PRN

## 2011-08-15 NOTE — Patient Instructions (Signed)
Tries Silenorr 6 mg  Call in a couple weeks about how it is doing and we can make plans for next step or continue.

## 2011-08-15 NOTE — Progress Notes (Signed)
  Subjective:    Patient ID: Sharon Mercado, female    DOB: 1970/08/26, 41 y.o.   MRN: [id number]  HPI  Patient comes in today for followup of sleep disturbance. Still continuing to do healthy lifestyle exercise yoga. She is seeing her gynecologist Dr. Hyacinth Meeker Taking prometrium 100 mg  Helped sleep and now 200.  Helps with pms. But sleep is still problematic. She had suggested trying Effexor. trazadone has se.   Sweats and sedation the next day Taking zoloft 25 mg which helps anxiety but not the irritability from lack of sleep. She doesn't prefer to increase the dose because of sexual side effects. In the past Lexapro caused her to feel wired.  His try not to use the Ambien on a basis. Her husband has been tried on Klonopin. She asks about this. Review of Systems Negative chest pain shortness of breath gets yearly thyroid check.  Has a family history of sleep issues. Past history family history social history reviewed in the electronic medical record.     Objective:   Physical Exam BP 104/70  Pulse 120  Temp(Src) 98.2 F (36.8 C) (Oral)  Wt 130 lb (58.968 kg)  SpO2 95%  LMP 07/26/2011 Well-developed well-nourished in no acute distress.  Animated looks well. Repeat pulse is 78 and regular No tremor skin changes Oriented x 3 and no noted deficits in memory, attention, and speech.     Assessment & Plan:  Continued sleep disturbance insomnia falling asleep and staying asleep.  Trying to avoid dependent producing medication at this time. Hormones some help having side effects of other medications we are trying to balance side effects with good effects  She can tried the samples of silent or 6 mg at night 16 pills given consider low-dose Remeron although she wants to avoid weight gain.  Clonopin can be helpful at night for anxiety but  is  dependent producing,unsure whether it will be a better choice. Increase in Zoloft might help her sleep if it's from anxiety interference however  we are trying to avoid sexual side effects.  Total visit > 50% spent counseling and coordinating care

## 2011-08-19 ENCOUNTER — Telehealth: Payer: Self-pay | Admitting: *Deleted

## 2011-08-19 NOTE — Telephone Encounter (Signed)
Pt. Wants Dr. Fabian Sharp to know she does like the Silenor, but would like it in generic, please.

## 2011-08-19 NOTE — Telephone Encounter (Signed)
Pls advise.  

## 2011-08-20 NOTE — Telephone Encounter (Signed)
There is no generic on that dose but she can come and get the rest of the samples that are to expire soon. After that call again bout  Refill or generic alternative.

## 2011-08-20 NOTE — Telephone Encounter (Signed)
Left a message for pt that samples were ready for pickup.

## 2011-09-23 ENCOUNTER — Telehealth: Payer: Self-pay

## 2011-09-23 MED ORDER — DOXEPIN HCL 10 MG PO CAPS: 10.0000 mg | ORAL_CAPSULE | Freq: Every day | ORAL | Status: DC

## 2011-09-23 NOTE — Telephone Encounter (Signed)
Pls advise.  

## 2011-09-23 NOTE — Telephone Encounter (Signed)
Ok to try doxepin 10 mg 1 po hs for sleep  rx sent in

## 2011-09-23 NOTE — Telephone Encounter (Signed)
Left a detailed message for pt that rx has been sent to pharmacy.

## 2011-09-23 NOTE — Telephone Encounter (Signed)
Pt has been getting samples of Silenor 6mg .  She would like to get a prescription for the generic in 10mg .  The 6mg  only last about 6 hours and she feels she would do better on the 10mg .

## 2011-10-13 ENCOUNTER — Other Ambulatory Visit: Payer: Self-pay | Admitting: Family Medicine

## 2011-10-13 ENCOUNTER — Other Ambulatory Visit: Payer: Self-pay | Admitting: Internal Medicine

## 2011-10-14 NOTE — Telephone Encounter (Signed)
This should go to Dr. Panosh  

## 2011-10-14 NOTE — Telephone Encounter (Signed)
Ok to refill x 6 months 

## 2011-10-15 ENCOUNTER — Other Ambulatory Visit: Payer: Self-pay | Admitting: Family Medicine

## 2011-10-15 MED ORDER — SERTRALINE HCL 50 MG PO TABS: 50.0000 mg | ORAL_TABLET | Freq: Every day | ORAL | Status: DC

## 2011-10-15 NOTE — Telephone Encounter (Signed)
Contact patient and ask how she is taking the ambien for sleep and what else is she trying now.  Before we refill this.

## 2011-10-15 NOTE — Telephone Encounter (Signed)
Last seen 08/15/11.  Has no follow up appt. Please advise.

## 2011-10-17 ENCOUNTER — Telehealth: Payer: Self-pay | Admitting: Internal Medicine

## 2011-10-17 NOTE — Telephone Encounter (Signed)
Caller: Clarisse/Patient; PCP: Madelin Headings.; CB#: [phone]; ; ; Call regarding States That Dr. Fabian Sharp Placed Her On Doxipen for Sleep. Asked Pt To Call With an Update As To How Med Was Working. LMP 09/26/11 approx. Pt States That Doxipen Is Working [place] Except for When She Is On Her Period. Wants To Know If Dr. Fabian Sharp Will Refill Her Ambien for That Time of the Month. Would Like for It To Be Called [facility] To [facility];[phone].;

## 2011-10-17 NOTE — Telephone Encounter (Signed)
Sent from CAN pool

## 2011-10-18 ENCOUNTER — Other Ambulatory Visit: Payer: Self-pay | Admitting: Family Medicine

## 2011-10-18 MED ORDER — ZOLPIDEM TARTRATE 10 MG PO TABS: 10.0000 mg | ORAL_TABLET | Freq: Every evening | ORAL | Status: DC | PRN

## 2011-10-18 NOTE — Telephone Encounter (Signed)
Called to the pharmacist 

## 2011-10-18 NOTE — Telephone Encounter (Signed)
Ok  To refill ambien disp 30 no refills  10 mg   Se CAN note

## 2011-12-26 ENCOUNTER — Other Ambulatory Visit: Payer: Self-pay | Admitting: Internal Medicine

## 2011-12-30 ENCOUNTER — Telehealth: Payer: Self-pay | Admitting: Family Medicine

## 2011-12-30 NOTE — Telephone Encounter (Signed)
Message sent to [facility].  Waiting on reply. 

## 2011-12-30 NOTE — Telephone Encounter (Signed)
Pt is requesting refills.  Last seen 08/15/11.  Last filled on 10/18/11.  Please advise.  Thanks!!!

## 2011-12-31 NOTE — Telephone Encounter (Signed)
Ok to refill x 1  

## 2012-01-01 NOTE — Telephone Encounter (Signed)
Called to the pharmacy and left of voicemail. 

## 2012-01-19 ENCOUNTER — Other Ambulatory Visit: Payer: Self-pay | Admitting: Internal Medicine

## 2012-01-28 ENCOUNTER — Telehealth: Payer: Self-pay | Admitting: Family Medicine

## 2012-01-28 NOTE — Telephone Encounter (Signed)
Refill x1 

## 2012-01-28 NOTE — Telephone Encounter (Signed)
Pt is requesting refills.  Last seen on 08/15/11.  No fu scheduled.  Last filled on 12/26/11 #30 with 0 additional refills.  Please advise.  Thanks!!

## 2012-01-28 NOTE — Telephone Encounter (Signed)
Message sent to [facility].  Waiting on response. 

## 2012-01-29 NOTE — Telephone Encounter (Signed)
Called to the pharmacy and left on voicemail. 

## 2012-02-11 ENCOUNTER — Other Ambulatory Visit: Payer: Self-pay | Admitting: Internal Medicine

## 2012-02-23 ENCOUNTER — Other Ambulatory Visit: Payer: Self-pay | Admitting: Internal Medicine

## 2012-03-12 ENCOUNTER — Telehealth: Payer: Self-pay | Admitting: Internal Medicine

## 2012-03-12 NOTE — Telephone Encounter (Signed)
Pt called re: a hernia issue that she has seen Dr Fabian Sharp a few months ago. Pt was advise by Dr Fabian Sharp, to call her if hernia started hurting, and Dr Fabian Sharp would do a referral. Pt says that her hernia is starting to hurt and would like to get referral done asap.

## 2012-03-13 ENCOUNTER — Other Ambulatory Visit: Payer: Self-pay | Admitting: Family Medicine

## 2012-03-13 DIAGNOSIS — K429 Umbilical hernia without obstruction or gangrene: Secondary | ICD-10-CM

## 2012-03-13 NOTE — Telephone Encounter (Signed)
Ok to refer.

## 2012-03-13 NOTE — Telephone Encounter (Signed)
Patient notified (left message on personally identified cell) to call oncall service or go to ED.

## 2012-03-13 NOTE — Telephone Encounter (Signed)
Referral placed in the system. 

## 2012-03-13 NOTE — Telephone Encounter (Signed)
Please refer to general surgery for small periumbilical hernia that is  Now symptomatic if becoming worse call on call  service .

## 2012-03-26 ENCOUNTER — Encounter (INDEPENDENT_AMBULATORY_CARE_PROVIDER_SITE_OTHER): Payer: Self-pay | Admitting: General Surgery

## 2012-03-26 ENCOUNTER — Ambulatory Visit (INDEPENDENT_AMBULATORY_CARE_PROVIDER_SITE_OTHER): Payer: No Typology Code available for payment source | Admitting: General Surgery

## 2012-03-26 VITALS — BP 102/70 | HR 72 | Temp 98.7°F | Resp 16 | Ht 68.0 in | Wt 129.0 lb

## 2012-03-26 DIAGNOSIS — M6208 Separation of muscle (nontraumatic), other site: Secondary | ICD-10-CM | POA: Insufficient documentation

## 2012-03-26 DIAGNOSIS — M6200 Separation of muscle (nontraumatic), unspecified site: Secondary | ICD-10-CM

## 2012-03-26 NOTE — Patient Instructions (Signed)
Call if area gets larger or causes more discomfort [phone] If your swallowing issues persists, you need to see a gastroenterologist

## 2012-03-26 NOTE — Progress Notes (Signed)
Patient ID: Sharon Mercado, female   DOB: [birth year], 41 y.o.   MRN: [id number]  Chief Complaint  Patient presents with  . New Evaluation    eval peri-umb hernia    HPI Sharon Mercado is a 41 y.o. female.   HPI 41 yo wf referred by Dr Fabian Sharp for evaluation of peri-umbilical hernia. The patient states that she has had a bulge in her upper midline for about 2 years. She complains of some throbbing pain. She also complains of indigestion and heartburn. She also states  That she has had difficulty swallowing solid foods over the past month or so. She states that it feels like it gets sort of stuck in her upper chest area. She states that she doesn't have this sensation with liquids but recently has noted some difficulty swallowing wine when she drinks it. She denies weight loss. She denies bloating. She denies early satiety.she denies regurgitation. She has tried over-the-counter reflux medication which has helped with her symptoms. She denies any prior abdominal surgery. She is quite active with exercising. She has had several children. She also notices small bellybutton hernia as well. It does not bother her. Past Medical History  Diagnosis Date  . Depression   . PMS (premenstrual syndrome)   . Insomnia   . Allergic rhinitis   . GERD (gastroesophageal reflux disease)     Past Surgical History  Procedure Date  . Brain surgery     breast enhancement    Family History  Problem Relation Age of Onset  . Depression Mother   . Hyperlipidemia    . Alcohol abuse    . Stroke    . Breast cancer      Social History History  Substance Use Topics  . Smoking status: Never Smoker   . Smokeless tobacco: Never Used  . Alcohol Use: 1.2 oz/week    2 Glasses of wine per week    No Known Allergies  Current Outpatient Prescriptions  Medication Sig Dispense Refill  . estrogens, conjugated, (PREMARIN) 0.45 MG tablet Take 200 mg by mouth daily. Take daily for 21 days then do not take for 7 days.       Marland Kitchen LORazepam (ATIVAN) 0.5 MG tablet take 1 to 2 tablets by mouth three times a day if needed for anxiety  20 tablet  0  . progesterone (PROMETRIUM) 200 MG capsule Take 200 mg by mouth daily.      . sertraline (ZOLOFT) 50 MG tablet Take 50 mg by mouth daily. take 1/2 tablet by mouth once daily      . zolpidem (AMBIEN) 10 MG tablet take 1 tablet by mouth at bedtime if needed  30 tablet  1  . [DISCONTINUED] sertraline (ZOLOFT) 50 MG tablet Take 1 tablet (50 mg total) by mouth daily. take 1 tablet by mouth once daily  30 tablet  5  . valACYclovir (VALTREX) 500 MG tablet Take 500 mg by mouth 2 (two) times daily. Prn       . [DISCONTINUED] sertraline (ZOLOFT) 25 MG tablet Take 1/2 daily  30 tablet  1  . [DISCONTINUED] sertraline (ZOLOFT) 50 MG tablet       . [DISCONTINUED] zolpidem (AMBIEN) 10 MG tablet Take 1 tablet (10 mg total) by mouth at bedtime as needed for sleep.  30 tablet  0  . [DISCONTINUED] zolpidem (AMBIEN) 10 MG tablet take 1 tablet by mouth at bedtime if needed  30 tablet  0    Review of Systems Review of  Systems  Constitutional: Negative for fever, chills and unexpected weight change.  HENT: Positive for trouble swallowing. Negative for hearing loss, congestion, sore throat and voice change.   Eyes: Negative for visual disturbance.  Respiratory: Negative for cough and wheezing.   Cardiovascular: Negative for chest pain, palpitations and leg swelling.  Gastrointestinal: Negative for nausea, vomiting, diarrhea, constipation, blood in stool, abdominal distention and anal bleeding.       See hpi  Genitourinary: Negative for hematuria, vaginal bleeding and difficulty urinating.  Musculoskeletal: Negative for arthralgias.  Skin: Negative for rash and wound.  Neurological: Negative for seizures, syncope and headaches.  Hematological: Negative for adenopathy. Does not bruise/bleed easily.  Psychiatric/Behavioral: Negative for confusion.    Blood pressure 102/70, pulse 72, temperature  98.7 F (37.1 C), temperature source Temporal, resp. rate 16, height 5\' 8"  (1.727 m), weight 129 lb (58.514 kg), last menstrual period 03/24/2012.  Physical Exam Physical Exam  Vitals reviewed. Constitutional: She is oriented to person, place, and time. She appears well-developed and well-nourished. No distress.  HENT:  Head: Normocephalic and atraumatic.  Right Ear: External ear normal.  Left Ear: External ear normal.  Eyes: Conjunctivae normal are normal. No scleral icterus.  Neck: Normal range of motion. Neck supple. No tracheal deviation present. No thyromegaly present.  Cardiovascular: Normal rate, regular rhythm and normal heart sounds.   Pulmonary/Chest: Effort normal and breath sounds normal. No respiratory distress. She has no wheezes.  Abdominal: Soft. Bowel sounds are normal. She exhibits no distension. There is no tenderness. There is no rebound.         Umbilical hernia - defect is about 3mm.  In upper midline - small area feels more linear about 2cm long feels c/w diastasis not hernia. Don't appreciated fascial defect. Pt examined supine, standing, and performing a crunch - no obvious bulge.   Musculoskeletal: She exhibits no edema.  Lymphadenopathy:    She has no cervical adenopathy.  Neurological: She is alert and oriented to person, place, and time. She exhibits normal muscle tone.  Skin: Skin is warm and dry. No rash noted. She is not diaphoretic. No erythema.  Psychiatric: She has a normal mood and affect. Her behavior is normal. Judgment and thought content normal.    Data Reviewed Dr. Rosezella [place] note   Assessment    Very small umbilical hernia Upper abdominal wall diastasis    Plan    Her umbilical hernia is quite small and asymptomatic. The upper midline bulge that she points to to me is more consistent with a diastases. I do not truly appreciate a fascial defect. I examined her multiple positions and I could not appreciate any fascial defects. To me it is  more consistent with a small area of rectus diastases. We discussed observation versus imaging of her abdominal wall such as a CT scan. We discussed hernias as well as rectus diastases. I explained that in my opinion this diastases is not causing her symptoms of difficulty with swallowing foods. She has elected to keep an eye on the area for now. If the symptoms worsen she will call back and we will order a CT scan of her abdomen. We also discussed referral to a plastic surgeon for the diastases.  With respect to her difficulty swallowing solid foods I advised her that she needs to be evaluated by gastroenterologist. I explained that in my opinion her diastases is not related to this problem. She states that it doesn't get better then she will seek evaluation for that.  Followup when necessary or call if abdominal wall area worsens  Mary Sella. Andrey Campanile, MD, FACS General, Bariatric, & Minimally Invasive Surgery [facility], [place]        [facility] M 03/26/2012, 10:24 AM

## 2012-05-09 ENCOUNTER — Ambulatory Visit (INDEPENDENT_AMBULATORY_CARE_PROVIDER_SITE_OTHER): Payer: No Typology Code available for payment source | Admitting: Internal Medicine

## 2012-05-09 ENCOUNTER — Encounter: Payer: Self-pay | Admitting: Internal Medicine

## 2012-05-09 VITALS — BP 110/78 | HR 68 | Temp 97.8°F | Wt 126.0 lb

## 2012-05-09 DIAGNOSIS — B001 Herpesviral vesicular dermatitis: Secondary | ICD-10-CM | POA: Insufficient documentation

## 2012-05-09 DIAGNOSIS — J019 Acute sinusitis, unspecified: Secondary | ICD-10-CM

## 2012-05-09 DIAGNOSIS — B009 Herpesviral infection, unspecified: Secondary | ICD-10-CM

## 2012-05-09 MED ORDER — AMOXICILLIN-POT CLAVULANATE 875-125 MG PO TABS: 1.0000 | ORAL_TABLET | Freq: Two times a day (BID) | ORAL | Status: DC

## 2012-05-09 NOTE — Progress Notes (Signed)
  Subjective:    Patient ID: Sharon Mercado, female    DOB: [birth year], 42 y.o.   MRN: [id number]  HPI  C/o sinus infection x 2 wks - green/yellow d/c C/o fever blisters H/o impetigo in the nose  Review of Systems  Constitutional: Positive for chills and fatigue. Negative for activity change, appetite change and unexpected weight change.  HENT: Positive for congestion, postnasal drip and sinus pressure. Negative for mouth sores.   Eyes: Negative for visual disturbance.  Respiratory: Negative for cough and chest tightness.   Gastrointestinal: Negative for nausea and abdominal pain.  Genitourinary: Negative for frequency, difficulty urinating and vaginal pain.  Musculoskeletal: Negative for back pain and gait problem.  Skin: Positive for rash. Negative for pallor.  Neurological: Negative for dizziness, tremors, weakness, numbness and headaches.  Psychiatric/Behavioral: Negative for confusion and sleep disturbance.       Objective:   Physical Exam  Constitutional: She appears well-developed. No distress.  HENT:  Head: Normocephalic.  Right Ear: External ear normal.  Left Ear: External ear normal.  Mouth/Throat: Oropharynx is clear and moist.       Swollen nasal mucosa, d/c  Eyes: Conjunctivae normal are normal. Pupils are equal, round, and reactive to light. Right eye exhibits no discharge. Left eye exhibits no discharge.  Neck: Normal range of motion. Neck supple. No JVD present. No tracheal deviation present. No thyromegaly present.  Cardiovascular: Normal rate, regular rhythm and normal heart sounds.   Pulmonary/Chest: No stridor. No respiratory distress. She has no wheezes.  Abdominal: Soft. Bowel sounds are normal. She exhibits no distension and no mass. There is no tenderness. There is no rebound and no guarding.  Musculoskeletal: She exhibits no edema and no tenderness.  Lymphadenopathy:    She has no cervical adenopathy.  Neurological: She displays normal reflexes. No  cranial nerve deficit. She exhibits normal muscle tone. Coordination normal.  Skin: Rash (cluster of blisters on eryth base on L nostril) noted. No erythema.  Psychiatric: She has a normal mood and affect. Her behavior is normal. Judgment and thought content normal.          Assessment & Plan:

## 2012-05-09 NOTE — Assessment & Plan Note (Signed)
Valtrex po x 5 d - she has it at home

## 2012-05-09 NOTE — Assessment & Plan Note (Signed)
Augmentin x 10 d Probiotic 

## 2012-05-11 ENCOUNTER — Telehealth: Payer: Self-pay | Admitting: Internal Medicine

## 2012-05-11 NOTE — Telephone Encounter (Signed)
[facility] Triage Call Report Triage Record Num: [id number] Operator: Hillary Bow Patient Name: Tyler Robidoux Call Date & Time: 05/09/2012 8:27:12AM Patient Phone: [phone] PCP: Neta Mends. Panosh Patient Gender: Female PCP Fax : [phone] Patient DOB: Jun 13, 1970 [facility] Name: [facility] Reason for Call: Caller: Jolynne/Patient; PCP: Berniece Andreas (Family Practice); CB#: [id number]; Call regarding painful Blisters inside nose; onset 1-17. 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 Pt has same symptoms inside nose and is wating antibiotic. Pt refusing triage, requesting appt only. Next available appt scheduled at [facility], 0945 on 1-18. PT verbalized understanding. Protocol(s) Used: Information Only Calls, No Triage Recommended Outcome per Protocol: Provide Information or Advice Only Reason for Outcome: Requesting regular office appointment (Advise to call office when open.) Care Advice: ~ 01/18/

## 2012-07-16 ENCOUNTER — Other Ambulatory Visit: Payer: Self-pay | Admitting: Internal Medicine

## 2012-07-17 ENCOUNTER — Telehealth: Payer: Self-pay | Admitting: Family Medicine

## 2012-07-17 NOTE — Telephone Encounter (Signed)
I have not spoke to the pt.  I did refill her Ambien for 30 days.  Please make an appt for her to see [facility] for a med follow up.  Thanks!!

## 2012-07-20 NOTE — Telephone Encounter (Signed)
lmom for pt to sch appt °

## 2012-07-21 ENCOUNTER — Other Ambulatory Visit: Payer: Self-pay | Admitting: Internal Medicine

## 2012-07-22 NOTE — Telephone Encounter (Signed)
Pt has appt sch

## 2012-07-22 NOTE — Telephone Encounter (Signed)
lmom for pt to callback to sch appt °

## 2012-07-24 ENCOUNTER — Ambulatory Visit: Payer: No Typology Code available for payment source | Admitting: Internal Medicine

## 2012-08-10 ENCOUNTER — Ambulatory Visit (INDEPENDENT_AMBULATORY_CARE_PROVIDER_SITE_OTHER): Payer: No Typology Code available for payment source | Admitting: Internal Medicine

## 2012-08-10 ENCOUNTER — Encounter: Payer: Self-pay | Admitting: Internal Medicine

## 2012-08-10 VITALS — BP 104/68 | HR 80 | Temp 98.0°F | Wt 131.0 lb

## 2012-08-10 DIAGNOSIS — Z79899 Other long term (current) drug therapy: Secondary | ICD-10-CM

## 2012-08-10 DIAGNOSIS — G4700 Insomnia, unspecified: Secondary | ICD-10-CM

## 2012-08-10 MED ORDER — ZOLPIDEM TARTRATE 5 MG PO TABS: 5.0000 mg | ORAL_TABLET | Freq: Every evening | ORAL | Status: DC | PRN

## 2012-08-10 MED ORDER — NORTRIPTYLINE HCL 10 MG PO CAPS: 10.0000 mg | ORAL_CAPSULE | Freq: Every day | ORAL | Status: DC

## 2012-08-10 NOTE — Progress Notes (Signed)
Chief Complaint  Patient presents with  . Insomnia    Needs refills of Ambien.    HPI: Comes in for follow up of  medication management of insomnia  Has tried many types of meds either tey help bu cause se  Or not meds  Gave her side effect    Dizziness  And  Hallucinations      1. Only thinks that helps sleep without sig se  is [place] .     Mom has sleep issue  Also    prometrium helped with pms but now  Sleep.    husband snores some  But  Not always An issue if    Position ok    Still doing yoga  Techniques for anxiety issues .    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   Et al  Kids are now in school  ROS: See pertinent positives and negatives per HPI.  Past Medical History  Diagnosis Date  . Depression   . PMS (premenstrual syndrome)   . Insomnia   . Allergic rhinitis   . GERD (gastroesophageal reflux disease)     Family History  Problem Relation Age of Onset  . Depression Mother   . Hyperlipidemia    . Alcohol abuse    . Stroke    . Breast cancer      History   Social History  . Marital Status: Unknown    Spouse Name: N/A    Number of Children: N/A  . Years of Education: N/A   Social History Main Topics  . Smoking status: Never Smoker   . Smokeless tobacco: Never Used  . Alcohol Use: 1.2 oz/week    2 Glasses of wine per week  . Drug Use: None  . Sexually Active: None   Other Topics Concern  . 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 progesterone (PROMETRIUM) 200 MG capsule Take 200 mg by mouth daily.      . valACYclovir (VALTREX) 1000 MG tablet TAKE 2 TABLETS BY MOUTH TWICE DAILY  25 tablet  0  . [DISCONTINUED] zolpidem (AMBIEN) 10 MG tablet take 1 tablet by mouth at  bedtime if needed  30 tablet  0  . LORazepam (ATIVAN) 0.5 MG tablet take 1 to 2 tablets by mouth three times a day if needed for anxiety  20 tablet  0  . nortriptyline (PAMELOR) 10 MG capsule Take 1 capsule (10 mg total) by mouth at bedtime. Can increase to 20mg   30 capsule  2  . zolpidem (AMBIEN) 5 MG tablet Take 1 tablet (5 mg total) by mouth at bedtime as needed for sleep.  30 tablet  3  . [DISCONTINUED] amoxicillin-clavulanate (AUGMENTIN) 875-125 MG per tablet Take 1 tablet by mouth 2 (two) times daily.  20 tablet  0  . [DISCONTINUED] estrogens, conjugated, (PREMARIN) 0.45 MG tablet Take 200 mg by mouth daily. Take daily for 21 days then do not take for 7 days.      . [DISCONTINUED] sertraline (ZOLOFT) 50 MG tablet Take 50 mg by mouth daily. take 1/2 tablet by mouth once daily      . [  DISCONTINUED] valACYclovir (VALTREX) 500 MG tablet Take 500 mg by mouth 2 (two) times daily. Prn       No facility-administered encounter medications on file as of 08/10/2012.    EXAM:  BP 104/68  Pulse 80  Temp(Src) 98 F (36.7 C) (Oral)  Wt 131 lb (59.421 kg)  BMI 19.92 kg/m2  SpO2 99%  LMP 08/07/2012  Body mass index is 19.92 kg/(m^2).  GENERAL: vitals reviewed and listed above, alert, oriented, appears well hydrated and in no acute distress NECK: no obvious masses on inspection palpation   LUNGS: clear to auscultation bilaterally, no wheezes, rales or rhonchi, good air movement  CV: HRRR, no clubbing cyanosis or  peripheral edema nl cap refill   MS: moves all extremities without noticeable focal  abnormality  PSYCH: pleasant and cooperative, no obvious depression or anxiety  ASSESSMENT AND PLAN:  Discussed the following assessment and plan:  INSOMNIA, IDIOPATHIC - hx se of meds doing lsi to help . some underlying anxiety and hormonal but healthy  ls willing to try another tca  q 6 month checks or as needed -Patient advised to return or notify health care team  if symptoms worsen or  persist or new concerns arise.  Patient Instructions  Rx.  for ambien 5 mg per day   Limit  for female s.   Continue lifestyle intervention healthy eating and exercise . As best possible .   We can revisit this at a later  time  For other ideas.   Can try pamelor     To see if fewer side effects.      Neta Mends. Panosh M.D.

## 2012-08-10 NOTE — Patient Instructions (Signed)
Rx.  for ambien 5 mg per day   Limit  for female s.   Continue lifestyle intervention healthy eating and exercise . As best possible .   We can revisit this at a later  time  For other ideas.   Can try pamelor     To see if fewer side effects.

## 2012-08-16 ENCOUNTER — Encounter: Payer: Self-pay | Admitting: Internal Medicine

## 2012-08-16 DIAGNOSIS — Z79899 Other long term (current) drug therapy: Secondary | ICD-10-CM | POA: Insufficient documentation

## 2012-10-19 ENCOUNTER — Encounter: Payer: Self-pay | Admitting: Internal Medicine

## 2012-10-19 ENCOUNTER — Ambulatory Visit (INDEPENDENT_AMBULATORY_CARE_PROVIDER_SITE_OTHER): Payer: BC Managed Care – PPO | Admitting: Internal Medicine

## 2012-10-19 VITALS — BP 102/74 | HR 61 | Temp 98.2°F | Ht 68.25 in | Wt 129.0 lb

## 2012-10-19 DIAGNOSIS — G4700 Insomnia, unspecified: Secondary | ICD-10-CM

## 2012-10-19 DIAGNOSIS — Z0000 Encounter for general adult medical examination without abnormal findings: Secondary | ICD-10-CM

## 2012-10-19 DIAGNOSIS — N943 Premenstrual tension syndrome: Secondary | ICD-10-CM

## 2012-10-19 DIAGNOSIS — Z23 Encounter for immunization: Secondary | ICD-10-CM

## 2012-10-19 LAB — HEPATIC FUNCTION PANEL
Bilirubin, Direct: 0 mg/dL (ref 0.0–0.3)
Total Bilirubin: 0.5 mg/dL (ref 0.3–1.2)

## 2012-10-19 LAB — LIPID PANEL
Cholesterol: 195 mg/dL (ref 0–200)
LDL Cholesterol: 97 mg/dL (ref 0–99)
Triglycerides: 76 mg/dL (ref 0.0–149.0)
VLDL: 15.2 mg/dL (ref 0.0–40.0)

## 2012-10-19 LAB — CBC WITH DIFFERENTIAL/PLATELET
Eosinophils Absolute: 0.4 10*3/uL (ref 0.0–0.7)
MCHC: 33.7 g/dL (ref 30.0–36.0)
MCV: 98.2 fl (ref 78.0–100.0)
Monocytes Absolute: 0.5 10*3/uL (ref 0.1–1.0)
Neutrophils Relative %: 50.4 % (ref 43.0–77.0)
Platelets: 233 10*3/uL (ref 150.0–400.0)
WBC: 5.1 10*3/uL (ref 4.5–10.5)

## 2012-10-19 LAB — BASIC METABOLIC PANEL
BUN: 8 mg/dL (ref 6–23)
CO2: 27 mEq/L (ref 19–32)
Chloride: 102 mEq/L (ref 96–112)
Creatinine, Ser: 0.8 mg/dL (ref 0.4–1.2)

## 2012-10-19 NOTE — Progress Notes (Signed)
Chief Complaint  Patient presents with  . Annual Exam    HPI: Patient comes in today for Preventive Health Care visit   Back to work  Mining engineer.   Helps with family issues and self  Voice .   40 hours.   Kids in school.  Sleeping some better  Sleep  Seeing robin saunders   Hormone    bioidentical    through [facility]  Trying lunesta  . Generic .   2 mg. Less foggy  In am.  Feels  More grounded. Had  Breast surgery with tin last year Dr Benna Dunks . Doing well.  only uses ativan when  [place]  Rarely.  ROS:  GEN/ HEENT: No fever, significant weight changes sweats headaches vision problems hearing changes, CV/ PULM; No chest pain shortness of breath cough, syncope,edema  change in exercise tolerance. GI /GU: No adominal pain, vomiting, change in bowel habits. No blood in the stool. No significant GU symptoms. SKIN/HEME: ,no acute skin rashes suspicious lesions or bleeding. No lymphadenopathy, nodules, masses.  NEURO/ PSYCH:  No neurologic signs such as weakness numbness. No depression anxiety. IMM/ Allergy: No unusual infections.  Allergy .   REST of 12 system review negative except as per HPI   Past Medical History  Diagnosis Date  . Depression   . PMS (premenstrual syndrome)   . Insomnia   . Allergic rhinitis   . GERD (gastroesophageal reflux disease)     Family History  Problem Relation Age of Onset  . Depression Mother   . Hyperlipidemia    . Alcohol abuse    . Stroke    . Breast cancer      History   Social History  . Marital Status: Unknown    Spouse Name: N/A    Number of Children: N/A  . Years of Education: N/A   Social History Main Topics  . Smoking status: Never Smoker   . Smokeless tobacco: Never Used  . Alcohol Use: 1.2 oz/week    2 Glasses of wine per week  . Drug Use: None  . Sexually Active: None   Other Topics Concern  . 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 eszopiclone (LUNESTA) 2 MG TABS Take 2 mg by mouth at bedtime. Take immediately before bedtime      . LORazepam (ATIVAN) 0.5 MG tablet take 1 to 2 tablets by mouth three times a day if needed for anxiety  20 tablet  0  . progesterone (PROMETRIUM) 200 MG capsule Take 200 mg by mouth daily.      . valACYclovir (VALTREX) 1000 MG tablet TAKE 2 TABLETS BY MOUTH TWICE DAILY  25 tablet  0  . [DISCONTINUED] zolpidem (AMBIEN) 5 MG tablet Take 1 tablet (5 mg total) by mouth at bedtime as needed for sleep.  30 tablet  3  . [DISCONTINUED] nortriptyline (PAMELOR) 10 MG capsule Take 1 capsule (10 mg total) by mouth at bedtime. Can increase to 20mg   30 capsule  2   No facility-administered encounter medications on file as of 10/19/2012.    EXAM:  BP 102/74  Pulse 61  Temp(Src) 98.2 F (36.8 C) (Oral)  Ht 5' 8.25" (1.734 m)  Wt 129 lb (58.514 kg)  BMI 19.46 kg/m2  SpO2 99%  LMP 10/01/2012  Body mass index is 19.46 kg/(m^2).  Physical Exam: Vital signs reviewed ZOX:WRUE is a well-developed well-nourished alert cooperative   female who appears her stated age in no acute distress.  HEENT: normocephalic atraumatic , Eyes: PERRL EOM's full, conjunctiva clear, Nares: paten,t no deformity discharge or tenderness., Ears: no deformity EAC's clear TMs with normal landmarks. Mouth: clear OP, no lesions, edema.  Moist mucous membranes. Dentition in adequate repair. NECK: supple without masses, thyromegaly or bruits. CHEST/PULM:  Clear to auscultation and percussion breath sounds equal no wheeze , rales or rhonchi. No chest wall deformities or tenderness. CV: PMI is nondisplaced, S1 S2 no gallops, murmurs, rubs. Peripheral pulses are full without delay.No JVD .  Breast no dimpling no masses  Well healed surgical scars    ABDOMEN: Bowel sounds normal nontender  No guard or rebound, no hepato splenomegal no CVA tenderness.   Extremtities:  No clubbing cyanosis or edema, no acute joint swelling or redness no focal atrophy NEURO:  Oriented x3, cranial nerves 3-12 appear to be intact, no obvious focal weakness,gait within normal limits no abnormal reflexes or asymmetrical SKIN: No acute rashes normal turgor, color, no bruising or petechiae. PSYCH: Oriented, good eye contact, no obvious depression anxiety, cognition and judgment appear normal. LN: no cervical axillary inguinal adenopathy  Lab Results  Component Value Date   WBC 5.9 04/24/2011   HGB 13.1 04/24/2011   HCT 38.2 04/24/2011   PLT 240.0 04/24/2011   GLUCOSE 92 04/24/2011   CHOL 213* 04/24/2011   TRIG 87.0 04/24/2011   HDL 91.40 04/24/2011   LDLDIRECT 94.3 04/24/2011   LDLCALC 110* 08/11/2007   ALT 22 04/24/2011   AST 21 04/24/2011   NA 140 04/24/2011   K 4.2 04/24/2011   CL 104 04/24/2011   CREATININE 0.6 04/24/2011   BUN 12 04/24/2011   CO2 31 04/24/2011   TSH 0.52 04/24/2011    ASSESSMENT AND PLAN:  Discussed the following assessment and plan:  Visit for preventive health examination - Plan: Basic metabolic panel, CBC with Differential, Hepatic function panel, TSH, Lipid panel  INSOMNIA, IDIOPATHIC - multifactorial  PMS - under rx with progesterone  Need for Tdap vaccination - Plan: Tdap vaccine greater than or equal to 7yo IM  Patient Care Team: Madelin Headings, MD as PCP - General Alison Murray, MD as Attending Physician (Obstetrics and Gynecology) Patient Instructions  Continue lifestyle intervention healthy eating and exercise . Get mammogram when possible  .  Copy of updated medications when possible  Preventive Care for Adults, Female A healthy lifestyle and preventive care can promote health and wellness. Preventive health guidelines for women include the following key practices.  A routine yearly physical is a good way to check with your caregiver  about your health and preventive screening. It is a chance to share any concerns and updates on your health, and to receive a thorough exam.  Visit your dentist for a routine exam and preventive care every 6 months. Brush your teeth twice a day and floss once a day. Good oral hygiene prevents tooth decay and gum disease.  The frequency of eye exams is based on your age, health, family medical history, use of contact lenses, and other factors. Follow your caregiver's recommendations for frequency of eye exams.  Eat a healthy diet. Foods  like vegetables, fruits, whole grains, low-fat dairy products, and lean protein foods contain the nutrients you need without too many calories. Decrease your intake of foods high in solid fats, added sugars, and salt. Eat the right amount of calories for you.Get information about a proper diet from your caregiver, if necessary.  Regular physical exercise is one of the most important things you can do for your health. Most adults should get at least 150 minutes of moderate-intensity exercise (any activity that increases your heart rate and causes you to sweat) each week. In addition, most adults need muscle-strengthening exercises on 2 or more days a week.  Maintain a healthy weight. The body mass index (BMI) is a screening tool to identify possible weight problems. It provides an estimate of body fat based on height and weight. Your caregiver can help determine your BMI, and can help you achieve or maintain a healthy weight.For adults 20 years and older:  A BMI below 18.5 is considered underweight.  A BMI of 18.5 to 24.9 is normal.  A BMI of 25 to 29.9 is considered overweight.  A BMI of 30 and above is considered obese.  Maintain normal blood lipids and cholesterol levels by exercising and minimizing your intake of saturated fat. Eat a balanced diet with plenty of fruit and vegetables. Blood tests for lipids and cholesterol should begin at age 55 and be repeated  every 5 years. If your lipid or cholesterol levels are high, you are over 50, or you are at high risk for heart disease, you may need your cholesterol levels checked more frequently.Ongoing high lipid and cholesterol levels should be treated with medicines if diet and exercise are not effective.  If you smoke, find out from your caregiver how to quit. If you do not use tobacco, do not start.  If you are pregnant, do not drink alcohol. If you are breastfeeding, be very cautious about drinking alcohol. If you are not pregnant and choose to drink alcohol, do not exceed 1 drink per day. One drink is considered to be 12 ounces (355 mL) of beer, 5 ounces (148 mL) of wine, or 1.5 ounces (44 mL) of liquor.  Avoid use of street drugs. Do not share needles with anyone. Ask for help if you need support or instructions about stopping the use of drugs.  High blood pressure causes heart disease and increases the risk of stroke. Your blood pressure should be checked at least every 1 to 2 years. Ongoing high blood pressure should be treated with medicines if weight loss and exercise are not effective.  If you are 77 to 40 years old, ask your caregiver if you should take aspirin to prevent strokes.  Diabetes screening involves taking a blood sample to check your fasting blood sugar level. This should be done once every 3 years, after age 32, if you are within normal weight and without risk factors for diabetes. Testing should be considered at a younger age or be carried out more frequently if you are overweight and have at least 1 risk factor for diabetes.  Breast cancer screening is essential preventive care for women. You should practice "breast self-awareness." This means understanding the normal appearance and feel of your breasts and may include breast self-examination. Any changes detected, no matter how small, should be reported to a caregiver. Women in their 79s and 30s should have a clinical breast exam (CBE)  by a caregiver as part of a regular health exam every 1 to 3 years.  After age 55, women should have a CBE every year. Starting at age 19, women should consider having a mammography (breast X-ray test) every year. Women who have a family history of breast cancer should talk to their caregiver about genetic screening. Women at a high risk of breast cancer should talk to their caregivers about having magnetic resonance imaging (MRI) and a mammography every year.  The Pap test is a screening test for cervical cancer. A Pap test can show cell changes on the cervix that might become cervical cancer if left untreated. A Pap test is a procedure in which cells are obtained and examined from the lower end of the uterus (cervix).  Women should have a Pap test starting at age 30.  Between ages 80 and 73, Pap tests should be repeated every 2 years.  Beginning at age 23, you should have a Pap test every 3 years as long as the past 3 Pap tests have been normal.  Some women have medical problems that increase the chance of getting cervical cancer. Talk to your caregiver about these problems. It is especially important to talk to your caregiver if a new problem develops soon after your last Pap test. In these cases, your caregiver may recommend more frequent screening and Pap tests.  The above recommendations are the same for women who have or have not gotten the vaccine for human papillomavirus (HPV).  If you had a hysterectomy for a problem that was not cancer or a condition that could lead to cancer, then you no longer need Pap tests. Even if you no longer need a Pap test, a regular exam is a good idea to make sure no other problems are starting.  If you are between ages 26 and 53, and you have had normal Pap tests going back 10 years, you no longer need Pap tests. Even if you no longer need a Pap test, a regular exam is a good idea to make sure no other problems are starting.  If you have had past treatment for  cervical cancer or a condition that could lead to cancer, you need Pap tests and screening for cancer for at least 20 years after your treatment.  If Pap tests have been discontinued, risk factors (such as a new sexual partner) need to be reassessed to determine if screening should be resumed.  The HPV test is an additional test that may be used for cervical cancer screening. The HPV test looks for the virus that can cause the cell changes on the cervix. The cells collected during the Pap test can be tested for HPV. The HPV test could be used to screen women aged 65 years and older, and should be used in women of any age who have unclear Pap test results. After the age of 32, women should have HPV testing at the same frequency as a Pap test.  Colorectal cancer can be detected and often prevented. Most routine colorectal cancer screening begins at the age of 35 and continues through age 26. However, your caregiver may recommend screening at an earlier age if you have risk factors for colon cancer. On a yearly basis, your caregiver may provide home test kits to check for hidden blood in the stool. Use of a small camera at the end of a tube, to directly examine the colon (sigmoidoscopy or colonoscopy), can detect the earliest forms of colorectal cancer. Talk to your caregiver about this at age 32, when routine screening begins. Direct examination of  the colon should be repeated every 5 to 10 years through age 51, unless early forms of pre-cancerous polyps or small growths are found.  Hepatitis C blood testing is recommended for all people born from [birth year] through 1963 and any individual with known risks for hepatitis C.  Practice safe sex. Use condoms and avoid high-risk sexual practices to reduce the spread of sexually transmitted infections (STIs). STIs include gonorrhea, chlamydia, syphilis, trichomonas, herpes, HPV, and human immunodeficiency virus (HIV). Herpes, HIV, and HPV are viral illnesses that have  no cure. They can result in disability, cancer, and death. Sexually active women aged 27 and younger should be checked for chlamydia. Older women with new or multiple partners should also be tested for chlamydia. Testing for other STIs is recommended if you are sexually active and at increased risk.  Osteoporosis is a disease in which the bones lose minerals and strength with aging. This can result in serious bone fractures. The risk of osteoporosis can be identified using a bone density scan. Women ages 3 and over and women at risk for fractures or osteoporosis should discuss screening with their caregivers. Ask your caregiver whether you should take a calcium supplement or vitamin D to reduce the rate of osteoporosis.  Menopause can be associated with physical symptoms and risks. Hormone replacement therapy is available to decrease symptoms and risks. You should talk to your caregiver about whether hormone replacement therapy is right for you.  Use sunscreen with sun protection factor (SPF) of 30 or more. Apply sunscreen liberally and repeatedly throughout the day. You should seek shade when your shadow is shorter than you. Protect yourself by wearing long sleeves, pants, a wide-brimmed hat, and sunglasses year round, whenever you are outdoors.  Once a month, do a whole body skin exam, using a mirror to look at the skin on your back. Notify your caregiver of new moles, moles that have irregular borders, moles that are larger than a pencil eraser, or moles that have changed in shape or color.  Stay current with required immunizations.  Influenza. You need a dose every fall (or winter). The composition of the flu vaccine changes each year, so being vaccinated once is not enough.  Pneumococcal polysaccharide. You need 1 to 2 doses if you smoke cigarettes or if you have certain chronic medical conditions. You need 1 dose at age 68 (or older) if you have never been vaccinated.  Tetanus, diphtheria,  pertussis (Tdap, Td). Get 1 dose of Tdap vaccine if you are younger than age 30, are over 19 and have contact with an infant, are a Research scientist (physical sciences), are pregnant, or simply want to be protected from whooping cough. After that, you need a Td booster dose every 10 years. Consult your caregiver if you have not had at least 3 tetanus and diphtheria-containing shots sometime in your life or have a deep or dirty wound.  HPV. You need this vaccine if you are a woman age 27 or younger. The vaccine is given in 3 doses over 6 months.  Measles, mumps, rubella (MMR). You need at least 1 dose of MMR if you were born in 1957 or later. You may also need a second dose.  Meningococcal. If you are age 24 to 44 and a first-year college student living in a residence hall, or have one of several medical conditions, you need to get vaccinated against meningococcal disease. You may also need additional booster doses.  Zoster (shingles). If you are age 32 or older, you  should get this vaccine.  Varicella (chickenpox). If you have never had chickenpox or you were vaccinated but received only 1 dose, talk to your caregiver to find out if you need this vaccine.  Hepatitis A. You need this vaccine if you have a specific risk factor for hepatitis A virus infection or you simply wish to be protected from this disease. The vaccine is usually given as 2 doses, 6 to 18 months apart.  Hepatitis B. You need this vaccine if you have a specific risk factor for hepatitis B virus infection or you simply wish to be protected from this disease. The vaccine is given in 3 doses, usually over 6 months. Preventive Services / Frequency Ages 40 to 73  Blood pressure check.** / Every 1 to 2 years.  Lipid and cholesterol check.** / Every 5 years beginning at age 4.  Clinical breast exam.** / Every 3 years for women in their 33s and 30s.  Pap test.** / Every 2 years from ages 109 through 91. Every 3 years starting at age 3 through age 21  or 26 with a history of 3 consecutive normal Pap tests.  HPV screening.** / Every 3 years from ages 28 through ages 40 to 27 with a history of 3 consecutive normal Pap tests.  Hepatitis C blood test.** / For any individual with known risks for hepatitis C.  Skin self-exam. / Monthly.  Influenza immunization.** / Every year.  Pneumococcal polysaccharide immunization.** / 1 to 2 doses if you smoke cigarettes or if you have certain chronic medical conditions.  Tetanus, diphtheria, pertussis (Tdap, Td) immunization. / A one-time dose of Tdap vaccine. After that, you need a Td booster dose every 10 years.  HPV immunization. / 3 doses over 6 months, if you are 50 and younger.  Measles, mumps, rubella (MMR) immunization. / You need at least 1 dose of MMR if you were born in 1957 or later. You may also need a second dose.  Meningococcal immunization. / 1 dose if you are age 68 to 20 and a first-year college student living in a residence hall, or have one of several medical conditions, you need to get vaccinated against meningococcal disease. You may also need additional booster doses.  Varicella immunization.** / Consult your caregiver.  Hepatitis A immunization.** / Consult your caregiver. 2 doses, 6 to 18 months apart.  Hepatitis B immunization.** / Consult your caregiver. 3 doses usually over 6 months. Ages 5 to 32  Blood pressure check.** / Every 1 to 2 years.  Lipid and cholesterol check.** / Every 5 years beginning at age 25.  Clinical breast exam.** / Every year after age 68.  Mammogram.** / Every year beginning at age 90 and continuing for as long as you are in good health. Consult with your caregiver.  Pap test.** / Every 3 years starting at age 60 through age 5 or 62 with a history of 3 consecutive normal Pap tests.  HPV screening.** / Every 3 years from ages 42 through ages 70 to 22 with a history of 3 consecutive normal Pap tests.  Fecal occult blood test (FOBT) of stool.  / Every year beginning at age 14 and continuing until age 43. You may not need to do this test if you get a colonoscopy every 10 years.  Flexible sigmoidoscopy or colonoscopy.** / Every 5 years for a flexible sigmoidoscopy or every 10 years for a colonoscopy beginning at age 31 and continuing until age 7.  Hepatitis C blood test.** /  For all people born from [birth year] through 1963 and any individual with known risks for hepatitis C.  Skin self-exam. / Monthly.  Influenza immunization.** / Every year.  Pneumococcal polysaccharide immunization.** / 1 to 2 doses if you smoke cigarettes or if you have certain chronic medical conditions.  Tetanus, diphtheria, pertussis (Tdap, Td) immunization.** / A one-time dose of Tdap vaccine. After that, you need a Td booster dose every 10 years.  Measles, mumps, rubella (MMR) immunization. / You need at least 1 dose of MMR if you were born in 1957 or later. You may also need a second dose.  Varicella immunization.** / Consult your caregiver.  Meningococcal immunization.** / Consult your caregiver.  Hepatitis A immunization.** / Consult your caregiver. 2 doses, 6 to 18 months apart.  Hepatitis B immunization.** / Consult your caregiver. 3 doses, usually over 6 months.     Neta Mends. Ramzy Cappelletti M.D.

## 2012-10-19 NOTE — Patient Instructions (Addendum)
Continue lifestyle intervention healthy eating and exercise . Get mammogram when possible  .  Copy of updated medications when possible  Preventive Care for Adults, Female A healthy lifestyle and preventive care can promote health and wellness. Preventive health guidelines for women include the following key practices.  A routine yearly physical is a good way to check with your caregiver about your health and preventive screening. It is a chance to share any concerns and updates on your health, and to receive a thorough exam.  Visit your dentist for a routine exam and preventive care every 6 months. Brush your teeth twice a day and floss once a day. Good oral hygiene prevents tooth decay and gum disease.  The frequency of eye exams is based on your age, health, family medical history, use of contact lenses, and other factors. Follow your caregiver's recommendations for frequency of eye exams.  Eat a healthy diet. Foods like vegetables, fruits, whole grains, low-fat dairy products, and lean protein foods contain the nutrients you need without too many calories. Decrease your intake of foods high in solid fats, added sugars, and salt. Eat the right amount of calories for you.Get information about a proper diet from your caregiver, if necessary.  Regular physical exercise is one of the most important things you can do for your health. Most adults should get at least 150 minutes of moderate-intensity exercise (any activity that increases your heart rate and causes you to sweat) each week. In addition, most adults need muscle-strengthening exercises on 2 or more days a week.  Maintain a healthy weight. The body mass index (BMI) is a screening tool to identify possible weight problems. It provides an estimate of body fat based on height and weight. Your caregiver can help determine your BMI, and can help you achieve or maintain a healthy weight.For adults 20 years and older:  A BMI below 18.5 is  considered underweight.  A BMI of 18.5 to 24.9 is normal.  A BMI of 25 to 29.9 is considered overweight.  A BMI of 30 and above is considered obese.  Maintain normal blood lipids and cholesterol levels by exercising and minimizing your intake of saturated fat. Eat a balanced diet with plenty of fruit and vegetables. Blood tests for lipids and cholesterol should begin at age 44 and be repeated every 5 years. If your lipid or cholesterol levels are high, you are over 50, or you are at high risk for heart disease, you may need your cholesterol levels checked more frequently.Ongoing high lipid and cholesterol levels should be treated with medicines if diet and exercise are not effective.  If you smoke, find out from your caregiver how to quit. If you do not use tobacco, do not start.  If you are pregnant, do not drink alcohol. If you are breastfeeding, be very cautious about drinking alcohol. If you are not pregnant and choose to drink alcohol, do not exceed 1 drink per day. One drink is considered to be 12 ounces (355 mL) of beer, 5 ounces (148 mL) of wine, or 1.5 ounces (44 mL) of liquor.  Avoid use of street drugs. Do not share needles with anyone. Ask for help if you need support or instructions about stopping the use of drugs.  High blood pressure causes heart disease and increases the risk of stroke. Your blood pressure should be checked at least every 1 to 2 years. Ongoing high blood pressure should be treated with medicines if weight loss and exercise are not effective.  If you are 36 to 40 years old, ask your caregiver if you should take aspirin to prevent strokes.  Diabetes screening involves taking a blood sample to check your fasting blood sugar level. This should be done once every 3 years, after age 77, if you are within normal weight and without risk factors for diabetes. Testing should be considered at a younger age or be carried out more frequently if you are overweight and have at  least 1 risk factor for diabetes.  Breast cancer screening is essential preventive care for women. You should practice "breast self-awareness." This means understanding the normal appearance and feel of your breasts and may include breast self-examination. Any changes detected, no matter how small, should be reported to a caregiver. Women in their 24s and 30s should have a clinical breast exam (CBE) by a caregiver as part of a regular health exam every 1 to 3 years. After age 62, women should have a CBE every year. Starting at age 67, women should consider having a mammography (breast X-ray test) every year. Women who have a family history of breast cancer should talk to their caregiver about genetic screening. Women at a high risk of breast cancer should talk to their caregivers about having magnetic resonance imaging (MRI) and a mammography every year.  The Pap test is a screening test for cervical cancer. A Pap test can show cell changes on the cervix that might become cervical cancer if left untreated. A Pap test is a procedure in which cells are obtained and examined from the lower end of the uterus (cervix).  Women should have a Pap test starting at age 60.  Between ages 67 and 72, Pap tests should be repeated every 2 years.  Beginning at age 2, you should have a Pap test every 3 years as long as the past 3 Pap tests have been normal.  Some women have medical problems that increase the chance of getting cervical cancer. Talk to your caregiver about these problems. It is especially important to talk to your caregiver if a new problem develops soon after your last Pap test. In these cases, your caregiver may recommend more frequent screening and Pap tests.  The above recommendations are the same for women who have or have not gotten the vaccine for human papillomavirus (HPV).  If you had a hysterectomy for a problem that was not cancer or a condition that could lead to cancer, then you no longer  need Pap tests. Even if you no longer need a Pap test, a regular exam is a good idea to make sure no other problems are starting.  If you are between ages 22 and 60, and you have had normal Pap tests going back 10 years, you no longer need Pap tests. Even if you no longer need a Pap test, a regular exam is a good idea to make sure no other problems are starting.  If you have had past treatment for cervical cancer or a condition that could lead to cancer, you need Pap tests and screening for cancer for at least 20 years after your treatment.  If Pap tests have been discontinued, risk factors (such as a new sexual partner) need to be reassessed to determine if screening should be resumed.  The HPV test is an additional test that may be used for cervical cancer screening. The HPV test looks for the virus that can cause the cell changes on the cervix. The cells collected during the Pap test  can be tested for HPV. The HPV test could be used to screen women aged 68 years and older, and should be used in women of any age who have unclear Pap test results. After the age of 60, women should have HPV testing at the same frequency as a Pap test.  Colorectal cancer can be detected and often prevented. Most routine colorectal cancer screening begins at the age of 72 and continues through age 68. However, your caregiver may recommend screening at an earlier age if you have risk factors for colon cancer. On a yearly basis, your caregiver may provide home test kits to check for hidden blood in the stool. Use of a small camera at the end of a tube, to directly examine the colon (sigmoidoscopy or colonoscopy), can detect the earliest forms of colorectal cancer. Talk to your caregiver about this at age 50, when routine screening begins. Direct examination of the colon should be repeated every 5 to 10 years through age 77, unless early forms of pre-cancerous polyps or small growths are found.  Hepatitis C blood testing is  recommended for all people born from [birth year] through 1965 and any individual with known risks for hepatitis C.  Practice safe sex. Use condoms and avoid high-risk sexual practices to reduce the spread of sexually transmitted infections (STIs). STIs include gonorrhea, chlamydia, syphilis, trichomonas, herpes, HPV, and human immunodeficiency virus (HIV). Herpes, HIV, and HPV are viral illnesses that have no cure. They can result in disability, cancer, and death. Sexually active women aged 31 and younger should be checked for chlamydia. Older women with new or multiple partners should also be tested for chlamydia. Testing for other STIs is recommended if you are sexually active and at increased risk.  Osteoporosis is a disease in which the bones lose minerals and strength with aging. This can result in serious bone fractures. The risk of osteoporosis can be identified using a bone density scan. Women ages 24 and over and women at risk for fractures or osteoporosis should discuss screening with their caregivers. Ask your caregiver whether you should take a calcium supplement or vitamin D to reduce the rate of osteoporosis.  Menopause can be associated with physical symptoms and risks. Hormone replacement therapy is available to decrease symptoms and risks. You should talk to your caregiver about whether hormone replacement therapy is right for you.  Use sunscreen with sun protection factor (SPF) of 30 or more. Apply sunscreen liberally and repeatedly throughout the day. You should seek shade when your shadow is shorter than you. Protect yourself by wearing long sleeves, pants, a wide-brimmed hat, and sunglasses year round, whenever you are outdoors.  Once a month, do a whole body skin exam, using a mirror to look at the skin on your back. Notify your caregiver of new moles, moles that have irregular borders, moles that are larger than a pencil eraser, or moles that have changed in shape or color.  Stay current  with required immunizations.  Influenza. You need a dose every fall (or winter). The composition of the flu vaccine changes each year, so being vaccinated once is not enough.  Pneumococcal polysaccharide. You need 1 to 2 doses if you smoke cigarettes or if you have certain chronic medical conditions. You need 1 dose at age 17 (or older) if you have never been vaccinated.  Tetanus, diphtheria, pertussis (Tdap, Td). Get 1 dose of Tdap vaccine if you are younger than age 60, are over 8 and have contact with an infant,  are a Research scientist (physical sciences), are pregnant, or simply want to be protected from whooping cough. After that, you need a Td booster dose every 10 years. Consult your caregiver if you have not had at least 3 tetanus and diphtheria-containing shots sometime in your life or have a deep or dirty wound.  HPV. You need this vaccine if you are a woman age 32 or younger. The vaccine is given in 3 doses over 6 months.  Measles, mumps, rubella (MMR). You need at least 1 dose of MMR if you were born in 1957 or later. You may also need a second dose.  Meningococcal. If you are age 65 to 38 and a first-year college student living in a residence hall, or have one of several medical conditions, you need to get vaccinated against meningococcal disease. You may also need additional booster doses.  Zoster (shingles). If you are age 83 or older, you should get this vaccine.  Varicella (chickenpox). If you have never had chickenpox or you were vaccinated but received only 1 dose, talk to your caregiver to find out if you need this vaccine.  Hepatitis A. You need this vaccine if you have a specific risk factor for hepatitis A virus infection or you simply wish to be protected from this disease. The vaccine is usually given as 2 doses, 6 to 18 months apart.  Hepatitis B. You need this vaccine if you have a specific risk factor for hepatitis B virus infection or you simply wish to be protected from this disease.  The vaccine is given in 3 doses, usually over 6 months. Preventive Services / Frequency Ages 32 to 3  Blood pressure check.** / Every 1 to 2 years.  Lipid and cholesterol check.** / Every 5 years beginning at age 16.  Clinical breast exam.** / Every 3 years for women in their 70s and 30s.  Pap test.** / Every 2 years from ages 13 through 43. Every 3 years starting at age 9 through age 6 or 22 with a history of 3 consecutive normal Pap tests.  HPV screening.** / Every 3 years from ages 50 through ages 75 to 89 with a history of 3 consecutive normal Pap tests.  Hepatitis C blood test.** / For any individual with known risks for hepatitis C.  Skin self-exam. / Monthly.  Influenza immunization.** / Every year.  Pneumococcal polysaccharide immunization.** / 1 to 2 doses if you smoke cigarettes or if you have certain chronic medical conditions.  Tetanus, diphtheria, pertussis (Tdap, Td) immunization. / A one-time dose of Tdap vaccine. After that, you need a Td booster dose every 10 years.  HPV immunization. / 3 doses over 6 months, if you are 25 and younger.  Measles, mumps, rubella (MMR) immunization. / You need at least 1 dose of MMR if you were born in 1957 or later. You may also need a second dose.  Meningococcal immunization. / 1 dose if you are age 62 to 74 and a first-year college student living in a residence hall, or have one of several medical conditions, you need to get vaccinated against meningococcal disease. You may also need additional booster doses.  Varicella immunization.** / Consult your caregiver.  Hepatitis A immunization.** / Consult your caregiver. 2 doses, 6 to 18 months apart.  Hepatitis B immunization.** / Consult your caregiver. 3 doses usually over 6 months. Ages 56 to 14  Blood pressure check.** / Every 1 to 2 years.  Lipid and cholesterol check.** / Every 5 years beginning at  age 30.  Clinical breast exam.** / Every year after age  31.  Mammogram.** / Every year beginning at age 7 and continuing for as long as you are in good health. Consult with your caregiver.  Pap test.** / Every 3 years starting at age 69 through age 75 or 7 with a history of 3 consecutive normal Pap tests.  HPV screening.** / Every 3 years from ages 78 through ages 66 to 24 with a history of 3 consecutive normal Pap tests.  Fecal occult blood test (FOBT) of stool. / Every year beginning at age 7 and continuing until age 64. You may not need to do this test if you get a colonoscopy every 10 years.  Flexible sigmoidoscopy or colonoscopy.** / Every 5 years for a flexible sigmoidoscopy or every 10 years for a colonoscopy beginning at age 72 and continuing until age 49.  Hepatitis C blood test.** / For all people born from 65 through 1965 and any individual with known risks for hepatitis C.  Skin self-exam. / Monthly.  Influenza immunization.** / Every year.  Pneumococcal polysaccharide immunization.** / 1 to 2 doses if you smoke cigarettes or if you have certain chronic medical conditions.  Tetanus, diphtheria, pertussis (Tdap, Td) immunization.** / A one-time dose of Tdap vaccine. After that, you need a Td booster dose every 10 years.  Measles, mumps, rubella (MMR) immunization. / You need at least 1 dose of MMR if you were born in 1957 or later. You may also need a second dose.  Varicella immunization.** / Consult your caregiver.  Meningococcal immunization.** / Consult your caregiver.  Hepatitis A immunization.** / Consult your caregiver. 2 doses, 6 to 18 months apart.  Hepatitis B immunization.** / Consult your caregiver. 3 doses, usually over 6 months.

## 2012-10-20 ENCOUNTER — Encounter: Payer: Self-pay | Admitting: Family Medicine

## 2012-12-08 ENCOUNTER — Encounter: Payer: Self-pay | Admitting: Certified Nurse Midwife

## 2012-12-09 ENCOUNTER — Encounter: Payer: Self-pay | Admitting: Certified Nurse Midwife

## 2012-12-09 ENCOUNTER — Ambulatory Visit (INDEPENDENT_AMBULATORY_CARE_PROVIDER_SITE_OTHER): Payer: BC Managed Care – PPO | Admitting: Certified Nurse Midwife

## 2012-12-09 VITALS — BP 110/64 | HR 64 | Resp 16 | Ht 67.75 in | Wt 129.0 lb

## 2012-12-09 DIAGNOSIS — Z01419 Encounter for gynecological examination (general) (routine) without abnormal findings: Secondary | ICD-10-CM

## 2012-12-09 DIAGNOSIS — Z0000 Encounter for general adult medical examination without abnormal findings: Secondary | ICD-10-CM

## 2012-12-09 NOTE — Patient Instructions (Addendum)

## 2012-12-09 NOTE — Progress Notes (Signed)
42 y.o. G47P3003 Married Caucasian Fe here for annual exam. Periods normal,scant, no issues. No health issues.   Using bio identical  Progesterone and feeling better with use.  Dr. Allyne Gee manages. Sees PCP for aex and labs, all normal per patient.  No health issues today. Sharon Mercado now 60! 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  Patient's last menstrual period was 11/22/2012.          Sexually active: yes  The current method of family planning is vasectomy.    Exercising: yes  cardio & weights Smoker:  no  Health Maintenance: Pap:  10-18-10 neg MMG:  none Colonoscopy:  none BMD:   none TDaP: 6/14 Labs: Poct urine-neg Self breast exam: done occ   reports that she has never smoked. She has never used smokeless tobacco. She reports that she drinks about 4.2 ounces of alcohol per week. She reports that she does not use illicit drugs.  Past Medical History  Diagnosis Date  . Depression   . PMS (premenstrual syndrome)   . Insomnia   . Allergic rhinitis   . GERD (gastroesophageal reflux disease)   . Migraines     with aura    Past Surgical History  Procedure Laterality Date  . Breast surgery      breast enhancement    Current Outpatient Prescriptions  Medication Sig Dispense Refill  . eszopiclone (LUNESTA) 2 MG TABS Take 2 mg by mouth as needed. Take immediately before bedtime      . LORazepam (ATIVAN) 0.5 MG tablet take 1 to 2 tablets by mouth three times a day if needed for anxiety  20 tablet  0  . NONFORMULARY OR COMPOUNDED ITEM daily. biodentical hormone      . valACYclovir (VALTREX) 1000 MG tablet        No current facility-administered medications for this visit.    Family History  Problem Relation Age of Onset  . Depression Mother     ROS:  Pertinent items are noted in HPI.  Otherwise, a comprehensive ROS was negative.  Exam:   BP 110/64  Pulse 64  Resp 16  Ht 5' 7.75" (1.721 m)  Wt 129 lb (58.514 kg)  BMI 19.76 kg/m2  LMP 11/22/2012 Height: 5' 7.75"  (172.1 cm)  Ht Readings from Last 3 Encounters:  12/09/12 5' 7.75" (1.721 m)  10/19/12 5' 8.25" (1.734 m)  03/26/12 5\' 8"  (1.727 m)    General appearance: alert, cooperative and appears stated age Head: Normocephalic, without obvious abnormality, atraumatic Neck: no adenopathy, supple, symmetrical, trachea midline and thyroid normal to inspection and palpation Lungs: clear to auscultation bilaterally Breasts: normal appearance, no masses or tenderness, No nipple retraction or dimpling, No nipple discharge or bleeding, No axillary or supraclavicular adenopathy Heart: regular rate and rhythm Abdomen: soft, non-tender; no masses,  no organomegaly Extremities: extremities normal, atraumatic, no cyanosis or edema Skin: Skin color, texture, turgor normal. No rashes or lesions Lymph nodes: Cervical, supraclavicular, and axillary nodes normal. No abnormal inguinal nodes palpated Neurologic: Grossly normal   Pelvic: External genitalia:  no lesions              Urethra:  normal appearing urethra with no masses, tenderness or lesions              Bartholin's and Skene's: normal                 Vagina: normal appearing vagina with normal color and discharge, no lesions  Cervix: normal, non tender              Pap taken: yes Bimanual Exam:  Uterus:  normal size, contour, position, consistency, mobility, non-tender and anteverted              Adnexa: normal adnexa and no mass, fullness, tenderness               Rectovaginal: Confirms               Anus:  normal sphincter tone, no lesions  A:  Well Woman with normal exam  Progesterone cream use for PMS managed by Dr. Allyne Gee  P:  Reviewed health and wellness pertinent to exam  Discussed importance of monitoring dosage use, patient feels comfortable with status at this time   Pap smear as per guidelines   Mammogram start at 40 given information to schedule pap smear taken today with HPVHR counseled on breast self exam, mammography  screening, adequate intake of calcium and vitamin D, diet and exercise  return annually or prn  An After Visit Summary was printed and given to the patient.

## 2012-12-15 NOTE — Progress Notes (Signed)
Note reviewed, agree with plan.  Meighan Treto, MD  

## 2013-01-31 ENCOUNTER — Other Ambulatory Visit: Payer: Self-pay | Admitting: Internal Medicine

## 2013-02-02 NOTE — Telephone Encounter (Signed)
There is not a sig in the system.  Your name is attached to the medication.  Are you prescribing?

## 2013-02-03 NOTE — Telephone Encounter (Signed)
Ok to do I have rx this before for her  Also.

## 2013-02-11 ENCOUNTER — Other Ambulatory Visit: Payer: Self-pay | Admitting: Internal Medicine

## 2013-07-05 ENCOUNTER — Other Ambulatory Visit: Payer: Self-pay | Admitting: Internal Medicine

## 2013-07-05 NOTE — Telephone Encounter (Signed)
Pt will be traveling and would like dr Fabian Sharppanosh to refill the LORazepam (ATIVAN) 0.5 MG tablet. [facility] [address]

## 2013-07-06 NOTE — Telephone Encounter (Signed)
Last filled on 07/10/11.  Had CPE on 10/19/2012.  Has no future appointment. Please advise.  Thanks!

## 2013-07-08 NOTE — Telephone Encounter (Signed)
Ok to refill x 1  

## 2014-02-21 ENCOUNTER — Encounter: Payer: Self-pay | Admitting: Certified Nurse Midwife

## 2014-07-25 ENCOUNTER — Ambulatory Visit (INDEPENDENT_AMBULATORY_CARE_PROVIDER_SITE_OTHER): Payer: BLUE CROSS/BLUE SHIELD | Admitting: Certified Nurse Midwife

## 2014-07-25 ENCOUNTER — Encounter: Payer: Self-pay | Admitting: Certified Nurse Midwife

## 2014-07-25 VITALS — BP 106/64 | HR 68 | Resp 16 | Ht 67.75 in | Wt 130.0 lb

## 2014-07-25 DIAGNOSIS — Z01419 Encounter for gynecological examination (general) (routine) without abnormal findings: Secondary | ICD-10-CM

## 2014-07-25 DIAGNOSIS — Z124 Encounter for screening for malignant neoplasm of cervix: Secondary | ICD-10-CM | POA: Diagnosis not present

## 2014-07-25 DIAGNOSIS — Z0000 Encounter for general adult medical examination without abnormal findings: Secondary | ICD-10-CM | POA: Diagnosis not present

## 2014-07-25 DIAGNOSIS — R319 Hematuria, unspecified: Secondary | ICD-10-CM

## 2014-07-25 LAB — POCT URINALYSIS DIPSTICK
Bilirubin, UA: NEGATIVE
Glucose, UA: NEGATIVE
KETONES UA: NEGATIVE
Leukocytes, UA: NEGATIVE
NITRITE UA: NEGATIVE
PH UA: 5
Protein, UA: NEGATIVE
Urobilinogen, UA: NEGATIVE

## 2014-07-25 NOTE — Patient Instructions (Signed)

## 2014-07-25 NOTE — Progress Notes (Signed)
44 y.o. G49P3003 Married  Caucasian Fe here for annual exam. Periods normal, no issues. Sees Sharon Mercado once yearly and labs for bioidentical progesterone 75 mg daily. Working so well for her PMS.  Has opened Yoga studio and boutique! Sharon Mercado now 68. Denies any UTI symptoms. No other health issues today.  Patient's last menstrual period was 07/08/2014.          Sexually active: Yes.    The current method of family planning is vasectomy.    Exercising: Yes.    cardio,yoga & pilates Smoker:  no  Health Maintenance: Pap: 12-09-12 neg HPV HR neg MMG:  None plans schedule now Colonoscopy:  none BMD:   none TDaP:  2014 Labs: Poct urine-rbc-tr Self breast exam: done occ   reports that she has never smoked. She has never used smokeless tobacco. She reports that she drinks about 4.2 oz of alcohol per week. She reports that she does not use illicit drugs.  Past Medical History  Diagnosis Date  . Depression   . PMS (premenstrual syndrome)   . Insomnia   . Allergic rhinitis   . GERD (gastroesophageal reflux disease)   . Migraines     with aura    Past Surgical History  Procedure Laterality Date  . Breast surgery      breast enhancement    Current Outpatient Prescriptions  Medication Sig Dispense Refill  . NONFORMULARY OR COMPOUNDED ITEM daily. biodentical hormone    . valACYclovir (VALTREX) 500 MG tablet 2 (two) times daily.   0   No current facility-administered medications for this visit.    Family History  Problem Relation Age of Onset  . Depression Mother     ROS:  Pertinent items are noted in HPI.  Otherwise, a comprehensive ROS was negative.  Exam:   BP 106/64 mmHg  Pulse 68  Resp 16  Ht 5' 7.75" (1.721 m)  Wt 130 lb (58.968 kg)  BMI 19.91 kg/m2  LMP 07/08/2014 Height: 5' 7.75" (172.1 cm) Ht Readings from Last 3 Encounters:  07/25/14 5' 7.75" (1.721 m)  12/09/12 5' 7.75" (1.721 m)  10/19/12 5' 8.25" (1.734 m)    General appearance: alert, cooperative and  appears stated age Head: Normocephalic, without obvious abnormality, atraumatic Neck: no adenopathy, supple, symmetrical, trachea midline and thyroid normal to inspection and palpation Lungs: clear to auscultation bilaterally CVAT bilateral non tender Breasts: normal appearance, no masses or tenderness, No nipple retraction or dimpling, No nipple discharge or bleeding, No axillary or supraclavicular adenopathy Heart: regular rate and rhythm Abdomen: soft, non-tender; no masses,  no organomegaly, negative suprapubic Extremities: extremities normal, atraumatic, no cyanosis or edema Skin: Skin color, texture, turgor normal. No rashes or lesions Lymph nodes: Cervical, supraclavicular, and axillary nodes normal. No abnormal inguinal nodes palpated Neurologic: Grossly normal   Pelvic: External genitalia:  no lesions              Urethra:  normal appearing urethra with no masses, tenderness or lesions  Bladder and urethral meatus non tender              Bartholin's and Skene's: normal                 Vagina: normal appearing vagina with normal color and discharge, no lesions              Cervix: normal,non tender              Pap taken: Yes.   Bimanual Exam:  Uterus:  normal size, contour, position, consistency, mobility, non-tender              Adnexa: normal adnexa and no mass, fullness, tenderness               Rectovaginal: Confirms               Anus:  normal sphincter tone, no lesions  Chaperone present: Yes  A:  Well Woman with normal exam  PMS being managed by Sharon Mercado with progesterone supplementation  Mammogram due, patient plans to schedule  R/O UTI  P:   Reviewed health and wellness pertinent to exam  Continue follow up as indicated  Lab urine micro  Pap smear taken today with HPV reflex   counseled on breast self exam, mammography screening, adequate intake of calcium and vitamin D, diet and exercise  return annually or prn  An After Visit Summary was printed and  given to the patient.

## 2014-07-25 NOTE — Progress Notes (Signed)
Reviewed personally.  M. Suzanne Evamae Rowen, MD.  

## 2014-07-26 LAB — URINALYSIS, MICROSCOPIC ONLY
Bacteria, UA: NONE SEEN
Casts: NONE SEEN
Crystals: NONE SEEN
Squamous Epithelial / LPF: NONE SEEN

## 2014-07-26 NOTE — Addendum Note (Signed)
Addended by: Verner CholLEONARD, DEBORAH S on: 07/26/2014 11:41 AM   Modules accepted: Orders, SmartSet

## 2014-07-28 LAB — IPS PAP TEST WITH REFLEX TO HPV

## 2018-11-04 ENCOUNTER — Encounter: Payer: Self-pay | Admitting: Certified Nurse Midwife

## 2018-11-17 ENCOUNTER — Ambulatory Visit (INDEPENDENT_AMBULATORY_CARE_PROVIDER_SITE_OTHER): Payer: Self-pay | Admitting: Psychiatry

## 2018-11-17 ENCOUNTER — Other Ambulatory Visit: Payer: Self-pay

## 2018-11-17 DIAGNOSIS — F4310 Post-traumatic stress disorder, unspecified: Secondary | ICD-10-CM
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
## 2018-11-18 ENCOUNTER — Encounter: Payer: Self-pay | Admitting: Psychiatry

## 2018-11-26 ENCOUNTER — Other Ambulatory Visit: Payer: Self-pay

## 2018-11-30 ENCOUNTER — Other Ambulatory Visit (HOSPITAL_COMMUNITY)
Admission: RE | Admit: 2018-11-30 | Discharge: 2018-11-30 | Disposition: A | Discharge status: Home / Self Care | Payer: Self-pay | Source: Ambulatory Visit | Attending: Certified Nurse Midwife | Admitting: Certified Nurse Midwife

## 2018-11-30 ENCOUNTER — Ambulatory Visit (INDEPENDENT_AMBULATORY_CARE_PROVIDER_SITE_OTHER): Payer: Self-pay | Admitting: Certified Nurse Midwife

## 2018-11-30 ENCOUNTER — Other Ambulatory Visit: Payer: Self-pay

## 2018-11-30 ENCOUNTER — Encounter: Payer: Self-pay | Admitting: Certified Nurse Midwife

## 2018-11-30 VITALS — BP 104/64 | HR 68 | Temp 97.3°F | Resp 16 | Ht 68.0 in | Wt 130.0 lb

## 2018-11-30 DIAGNOSIS — Z0000 Encounter for general adult medical examination without abnormal findings: Secondary | ICD-10-CM

## 2018-11-30 DIAGNOSIS — Z124 Encounter for screening for malignant neoplasm of cervix: Secondary | ICD-10-CM

## 2018-11-30 DIAGNOSIS — Z01419 Encounter for gynecological examination (general) (routine) without abnormal findings: Secondary | ICD-10-CM

## 2018-11-30 DIAGNOSIS — Z8619 Personal history of other infectious and parasitic diseases: Secondary | ICD-10-CM

## 2018-11-30 DIAGNOSIS — E559 Vitamin D deficiency, unspecified: Secondary | ICD-10-CM

## 2018-11-30 NOTE — Progress Notes (Signed)
48 y.o. G70P3003 Married  Caucasian Fe here to re-establish gyn care and for annual exam. Periods normal, no issues. Eating healthy and exercising. 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 Sees PCP for anxiety, insomina and GERD management. History of migraine with aura, uses OTC medication with good response. Was seen by MD regarding PMS with hormonal trial, no change. 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 Desires screening labs today. Aware mammogram due. No other health issues today. Family doing well, Oldest Threasa Heads is 20,(I delivered him).  Patient's last menstrual period was 11/21/2018 (exact date).          Sexually active: Yes.    The current method of family planning is vasectomy.    Exercising: Yes.    everything Smoker:  no  Review of Systems  Constitutional: Negative.   HENT: Negative.   Eyes: Negative.   Respiratory: Negative.   Cardiovascular: Negative.   Gastrointestinal: Negative.   Genitourinary: Negative.   Musculoskeletal: Negative.   Skin: Negative.   Neurological: Negative.   Endo/Heme/Allergies: Negative.   Psychiatric/Behavioral: Negative.     Health Maintenance: Pap:  2018 neg per patient History of Abnormal Pap: no MMG:  2018 neg Self Breast exams: yes Colonoscopy:  none BMD:   none TDaP:  2014 Shingles: no Pneumonia: no Hep C and HIV: during pregnancy neg Labs: yes   reports that she has never smoked. She has never used smokeless tobacco. She reports current alcohol use of about 7.0 standard drinks of alcohol per week. She reports that she does not use drugs.  Past Medical History:  Diagnosis Date  . Allergic rhinitis   . Anxiety   . GERD (gastroesophageal reflux disease)   . Insomnia   . Migraines    with aura  . PMS (premenstrual syndrome)   . Substance abuse ([place])    HSV    Past Surgical History:  Procedure Laterality Date  . BREAST SURGERY     breast enhancement    Current Outpatient Medications   Medication Sig Dispense Refill  . clonazePAM (KLONOPIN) 0.5 MG tablet TAKE ONE TABLET AT BEDTIME.    . hydrOXYzine (ATARAX/VISTARIL) 10 MG tablet Take by mouth.    . temazepam (RESTORIL) 15 MG capsule TAKE 1 CAPSULE AT BEDTIME AS NEEDED FOR SLEEP.    Marland Kitchen tiZANidine (ZANAFLEX) 4 MG tablet TAKE ONE TABLET AT BEDTIME.    . valACYclovir (VALTREX) 500 MG tablet 2 (two) times daily.   0   No current facility-administered medications for this visit.     Family History  Problem Relation Age of Onset  . Depression Mother   . Stroke Maternal Grandmother   . Heart attack Paternal Grandfather     ROS:  Pertinent items are noted in HPI.  Otherwise, a comprehensive ROS was negative.  Exam:   BP 104/64   Pulse 68   Temp (!) 97.3 F (36.3 C) (Skin)   Resp 16   Ht 5\' 8"  (1.727 m)   Wt 130 lb (59 kg)   LMP 11/21/2018 (Exact Date)   BMI 19.77 kg/m  Height: 5\' 8"  (172.7 cm) Ht Readings from Last 3 Encounters:  11/30/18 5\' 8"  (1.727 m)  07/25/14 5' 7.75" (1.721 m)  12/09/12 5' 7.75" (1.721 m)    General appearance: alert, cooperative and appears stated age Head: Normocephalic, without obvious abnormality, atraumatic Neck: no adenopathy, supple, symmetrical, trachea midline and thyroid normal to inspection and palpation Lungs: clear to auscultation bilaterally Breasts: normal  appearance, no masses or tenderness, No nipple retraction or dimpling, No nipple discharge or bleeding, No axillary or supraclavicular adenopathy, implants palpate intact bilateral Heart: regular rate and rhythm Abdomen: soft, non-tender; no masses,  no organomegaly Extremities: extremities normal, atraumatic, no cyanosis or edema Skin: Skin color, texture, turgor normal. No rashes or lesions Lymph nodes: Cervical, supraclavicular, and axillary nodes normal. No abnormal inguinal nodes palpated Neurologic: Grossly normal   Pelvic: External genitalia:  no lesions, normal female              Urethra:  normal appearing  urethra with no masses, tenderness or lesions              Bartholin's and Skene's: normal                 Vagina: normal appearing vagina with normal color and discharge, no lesions              Cervix: multiparous appearance, no cervical motion tenderness and no lesions              Pap taken: Yes.   Bimanual Exam:  Uterus:  normal size, contour, position, consistency, mobility, non-tender and anteverted              Adnexa: normal adnexa and no mass, fullness, tenderness               Rectovaginal: Confirms               Anus:  normal sphincter tone, no lesions  Chaperone present: yes  A:  Well Woman with normal exam  Contraception spouse vasectomy  Social stress with self owned business  Anxiety/depression,insomnia with PCP management  HSV 1 history PCP manages  Mammogram overdue  Screening labs  P:   Reviewed health and wellness pertinent to exam  Discussed importance of PCP follow up as indicated with medication management  Discussed importance of mammogram for early detection. Patient will schedule.  Labs: CMP,Lipid panel, TSH, CBC, Vitamin D  Pap smear: yes  counseled on breast self exam, mammography screening, feminine hygiene, menopause, adequate intake of calcium and vitamin D, diet and exercise, colonoscopy recommendations, declined IFOB today.  return annually or prn  An After Visit Summary was printed and given to the patient.

## 2018-11-30 NOTE — Patient Instructions (Signed)

## 2018-12-01 LAB — COMPREHENSIVE METABOLIC PANEL
ALT: 15 IU/L (ref 0–32)
AST: 15 IU/L (ref 0–40)
Albumin/Globulin Ratio: 2.7 — ABNORMAL HIGH (ref 1.2–2.2)
Albumin: 4.8 g/dL (ref 3.8–4.8)
Alkaline Phosphatase: 43 IU/L (ref 39–117)
BUN/Creatinine Ratio: 14 (ref 9–23)
BUN: 10 mg/dL (ref 6–24)
Bilirubin Total: 0.5 mg/dL (ref 0.0–1.2)
CO2: 27 mmol/L (ref 20–29)
Calcium: 9.3 mg/dL (ref 8.7–10.2)
Chloride: 102 mmol/L (ref 96–106)
Creatinine, Ser: 0.7 mg/dL (ref 0.57–1.00)
GFR calc Af Amer: 120 mL/min/{1.73_m2} (ref >59)
GFR calc non Af Amer: 104 mL/min/{1.73_m2} (ref >59)
Globulin, Total: 1.8 g/dL (ref 1.5–4.5)
Glucose: 79 mg/dL (ref 65–99)
Potassium: 4 mmol/L (ref 3.5–5.2)
Sodium: 142 mmol/L (ref 134–144)
Total Protein: 6.6 g/dL (ref 6.0–8.5)

## 2018-12-01 LAB — LIPID PANEL
Chol/HDL Ratio: 2.5 ratio (ref 0.0–4.4)
Cholesterol, Total: 220 mg/dL — ABNORMAL HIGH (ref 100–199)
HDL: 88 mg/dL (ref >39)
LDL Calculated: 118 mg/dL — ABNORMAL HIGH (ref 0–99)
Triglycerides: 71 mg/dL (ref 0–149)
VLDL Cholesterol Cal: 14 mg/dL (ref 5–40)

## 2018-12-01 LAB — CBC
Hematocrit: 41.5 % (ref 34.0–46.6)
Hemoglobin: 13.1 g/dL (ref 11.1–15.9)
MCH: 32.3 pg (ref 26.6–33.0)
MCHC: 31.6 g/dL (ref 31.5–35.7)
MCV: 103 fL — ABNORMAL HIGH (ref 79–97)
Platelets: 273 10*3/uL (ref 150–450)
RBC: 4.05 x10E6/uL (ref 3.77–5.28)
RDW: 12.6 % (ref 11.7–15.4)
WBC: 5.4 10*3/uL (ref 3.4–10.8)

## 2018-12-01 LAB — VITAMIN D 25 HYDROXY (VIT D DEFICIENCY, FRACTURES): Vit D, 25-Hydroxy: 32.7 ng/mL (ref 30.0–100.0)

## 2018-12-02 LAB — CYTOLOGY - PAP
Diagnosis: UNDETERMINED — AB
HPV: NOT DETECTED

## 2018-12-04 ENCOUNTER — Telehealth: Payer: Self-pay

## 2018-12-04 NOTE — Telephone Encounter (Signed)
Left message for call back.

## 2018-12-04 NOTE — Telephone Encounter (Signed)
-----   Message from Regina Eck, CNM sent at 12/04/2018  7:36 AM EDT ----- Notify patient her pap smear showed ASCUS but HPV negative No further evaluation at this point. Repeat pap smear one year 08

## 2018-12-04 NOTE — Telephone Encounter (Signed)
Patient notified of results. See lab 

## 2018-12-23 ENCOUNTER — Other Ambulatory Visit: Payer: Self-pay

## 2018-12-23 ENCOUNTER — Ambulatory Visit (INDEPENDENT_AMBULATORY_CARE_PROVIDER_SITE_OTHER): Payer: Self-pay | Admitting: Psychiatry

## 2018-12-23 ENCOUNTER — Encounter: Payer: Self-pay | Admitting: Psychiatry

## 2018-12-23 DIAGNOSIS — F4310 Post-traumatic stress disorder, unspecified: Secondary | ICD-10-CM
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
## 2018-12-30 ENCOUNTER — Encounter: Payer: Self-pay | Admitting: Psychiatry

## 2018-12-30 ENCOUNTER — Other Ambulatory Visit: Payer: Self-pay

## 2018-12-30 ENCOUNTER — Ambulatory Visit (INDEPENDENT_AMBULATORY_CARE_PROVIDER_SITE_OTHER): Payer: Self-pay | Admitting: Psychiatry

## 2018-12-30 DIAGNOSIS — F411 Generalized anxiety disorder: Secondary | ICD-10-CM
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
## 2019-01-07 ENCOUNTER — Other Ambulatory Visit: Payer: Self-pay

## 2019-01-07 ENCOUNTER — Ambulatory Visit (INDEPENDENT_AMBULATORY_CARE_PROVIDER_SITE_OTHER): Payer: Self-pay | Admitting: Psychiatry

## 2019-01-07 ENCOUNTER — Encounter: Payer: Self-pay | Admitting: Psychiatry

## 2019-01-07 DIAGNOSIS — F411 Generalized anxiety disorder: Secondary | ICD-10-CM

## 2019-01-07 NOTE — Progress Notes (Signed)
      [facility] Counselor/Therapist Progress Note  Patient ID: Sharon Mercado, MRN: [id number],    Date: 01/07/2019  Time Spent: 50 minutes   Treatment Type: Individual Therapy  Reported Symptoms: anxiety  Mental Status Exam:  Appearance:   Well Groomed     Behavior:  Appropriate  Motor:  Normal  Speech/Language:   Clear and Coherent  Affect:  Appropriate  Mood:  anxious  Thought process:  normal  Thought content:    WNL  Sensory/Perceptual disturbances:    WNL  Orientation:  oriented to person, place, time/date and situation  Attention:  Good  Concentration:  Good  Memory:  WNL  Fund of knowledge:   Good  Insight:    Good  Judgment:   Good  Impulse Control:  Good   Risk Assessment: Danger to Self:  No Self-injurious Behavior: No Danger to Others: No Duty to Warn:no Physical Aggression / Violence:No  Access to Firearms a concern: No  Gang Involvement:No   Subjective: The client states she feels much lighter after the last session.  "I need to talk about self worth today."  The client states she has trouble owning her own worth with her professional life and with money.  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  There is some self-doubt mixed with frustration connected to that. Using eye-movement we focused on the client's professional life, her negative thought is, "do not get your hopes up.  Manage your expectations."  She feels frustration in her torso and self-doubt in her shoulder blade.  As the client processed she remembered that in high school and college, "I was dirt poor.  I had to work really, really hard to make ends meet".  As the client continued to process she remembered her mother always saying, "I cannot have anything."  Since her parents lied all the time she could never depend on them for anything.  She stated that nothing they did ever really worked out.  Even if they took a trip in the car, the car would break down and  they could not go anywhere.  She realized that, "I could not depend on them for anything."  As the client continued to process the sense of self-doubt in her shoulder blade dispersed some.  I used the bilateral stimulation hand paddles with the client to visualize what that self-doubt looked like.  "It is a grey smoke."  Then I asked the client what love looked like if it was fluid?  "A gold sparkly liquid."  I have the client visualize that liquid being poured up on her shoulder blades and colliding with the gray smoke.  She saw it smother the gray smoke and then fill her up and build a shield around her of love.  Her positive cognition at the end of the session was, "I can give everything."  Her subjective units of distress went from a 6+ to 0.  Interventions: Assertiveness/Communication, Motivational Interviewing, Solution-Oriented/Positive Psychology, CIT Group Desensitization and Reprocessing (EMDR) and Insight-Oriented  Diagnosis:   ICD-10-CM   1. Generalized anxiety disorder  F41.1     Plan: Positive self talk, boundaries, assertiveness, self-care.  Mikeala Girdler, [facility]

## 2019-01-14 ENCOUNTER — Encounter: Payer: Self-pay | Admitting: Psychiatry

## 2019-01-14 ENCOUNTER — Ambulatory Visit (INDEPENDENT_AMBULATORY_CARE_PROVIDER_SITE_OTHER): Payer: Self-pay | Admitting: Psychiatry

## 2019-01-14 ENCOUNTER — Other Ambulatory Visit: Payer: Self-pay

## 2019-01-14 DIAGNOSIS — F411 Generalized anxiety disorder: Secondary | ICD-10-CM
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
## 2019-01-20 ENCOUNTER — Encounter: Payer: Self-pay | Admitting: Psychiatry

## 2019-01-20 ENCOUNTER — Ambulatory Visit (INDEPENDENT_AMBULATORY_CARE_PROVIDER_SITE_OTHER): Payer: Self-pay | Admitting: Psychiatry

## 2019-01-20 ENCOUNTER — Other Ambulatory Visit: Payer: Self-pay

## 2019-01-20 DIAGNOSIS — F4321 Adjustment disorder with depressed mood: Secondary | ICD-10-CM

## 2019-01-20 NOTE — Progress Notes (Signed)
      [facility] Counselor/Therapist Progress Note  Patient ID: Sharon Mercado, MRN: [id number],    Date: 01/20/2019  Time Spent: 50 minutes   Treatment Type: Individual Therapy  Reported Symptoms: sad, irritable  Mental Status Exam:  Appearance:   Well Groomed     Behavior:  Appropriate  Motor:  Normal  Speech/Language:   Clear and Coherent  Affect:  Appropriate  Mood:  irritable and sad  Thought process:  normal  Thought content:    WNL  Sensory/Perceptual disturbances:    WNL  Orientation:  oriented to person, place, time/date and situation  Attention:  Good  Concentration:  Good  Memory:  WNL  Fund of knowledge:   Good  Insight:    Good  Judgment:   Good  Impulse Control:  Good   Risk Assessment: Danger to Self:  No Self-injurious Behavior: No Danger to Others: No Duty to Warn:no Physical Aggression / Violence:No  Access to Firearms a concern: No  Gang Involvement:No   Subjective: The client states that she has lots of sadness and grief today.  Not only about her past but also about the people around her.  Her neighbor across the street is dying of breast cancer and she has 4 children.  She is also worried about the current political climate and the impact it will have on her children. The client is also sad that her relationship with her husband is not as good as it could be.  She identifies that he will not enter into a deeper emotional relationship with her or their children.  She thinks that he is fearful of those emotions.  He has had episodes where he has done better but then typically falls back into narcissistic behavior.  The client stated today that she will make sure of her youngest gets out of the house and then she Sharon Mercado leave him. This weekend she and her husband are going to [place], [place] for some time away.  I suggested to the client that she write a letter to her husband laying out her concerns and conditions for staying in the relationship.   "I have had these conversations before.  They never go anywhere."  I encouraged the client to write it out so she would not have to speak it and to be very clear that if it does not happen she will be gone.  The client felt like she could do this.  I used eye-movement with the client to reduce her subjective units of distress from an 8 to less than 3 at the end of the session.  Interventions: Assertiveness/Communication, Motivational Interviewing, Solution-Oriented/Positive Psychology, CIT Group Desensitization and Reprocessing (EMDR) and Insight-Oriented  Diagnosis:   ICD-10-CM   1. Adjustment disorder with depressed mood  F43.21     Plan: Letter to husband, boundaries, assertiveness, self-care.  Sharon Mercado, [facility]

## 2019-02-01 ENCOUNTER — Encounter: Payer: Self-pay | Admitting: Psychiatry

## 2019-02-01 ENCOUNTER — Other Ambulatory Visit: Payer: Self-pay

## 2019-02-01 ENCOUNTER — Ambulatory Visit (INDEPENDENT_AMBULATORY_CARE_PROVIDER_SITE_OTHER): Payer: Self-pay | Admitting: Psychiatry

## 2019-02-01 DIAGNOSIS — F4321 Adjustment disorder with depressed mood: Secondary | ICD-10-CM
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
## 2019-02-18 ENCOUNTER — Ambulatory Visit (INDEPENDENT_AMBULATORY_CARE_PROVIDER_SITE_OTHER): Payer: Self-pay | Admitting: Psychiatry

## 2019-02-18 ENCOUNTER — Other Ambulatory Visit: Payer: Self-pay

## 2019-02-18 ENCOUNTER — Encounter: Payer: Self-pay | Admitting: Psychiatry

## 2019-02-18 DIAGNOSIS — F4323 Adjustment disorder with mixed anxiety and depressed mood: Secondary | ICD-10-CM
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
## 2019-03-04 ENCOUNTER — Ambulatory Visit (INDEPENDENT_AMBULATORY_CARE_PROVIDER_SITE_OTHER): Payer: Self-pay | Admitting: Psychiatry

## 2019-03-04 ENCOUNTER — Encounter: Payer: Self-pay | Admitting: Psychiatry

## 2019-03-04 ENCOUNTER — Other Ambulatory Visit: Payer: Self-pay

## 2019-03-04 DIAGNOSIS — F4323 Adjustment disorder with mixed anxiety and depressed mood: Secondary | ICD-10-CM
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
## 2019-03-17 ENCOUNTER — Ambulatory Visit: Payer: Self-pay | Admitting: Psychiatry

## 2019-03-24 ENCOUNTER — Ambulatory Visit: Payer: Self-pay | Admitting: Psychiatry

## 2019-04-07 ENCOUNTER — Ambulatory Visit: Payer: Self-pay | Admitting: Psychiatry

## 2019-04-28 ENCOUNTER — Ambulatory Visit: Payer: Self-pay | Admitting: Psychiatry

## 2019-07-09 ENCOUNTER — Encounter: Payer: Self-pay | Admitting: Certified Nurse Midwife

## 2020-03-24 ENCOUNTER — Ambulatory Visit: Payer: Self-pay | Attending: Internal Medicine

## 2020-03-24 DIAGNOSIS — Z23 Encounter for immunization: Secondary | ICD-10-CM

## 2020-03-24 NOTE — Progress Notes (Signed)
   Covid-19 Vaccination Clinic  Name:  Sharon Mercado    MRN: [id number] DOB: 1971-02-23  03/24/2020  [name] was observed post Covid-19 immunization for 15 minutes without incident. She was provided with Vaccine Information Sheet and instruction to access the V-Safe system.   [name] was instructed to call 911 with any severe reactions post vaccine: Marland Kitchen Difficulty breathing  . Swelling of face and throat  . A fast heartbeat  . A bad rash all over body  . Dizziness and weakness   Immunizations Administered    Name Date Dose VIS Date Route   Pfizer COVID-19 Vaccine 03/24/2020  5:13 PM 0.3 mL 02/09/2020 Intramuscular   Manufacturer: [employer], [employer]   Lot: [id number]   NDC: [address]-4

## 2020-05-01 NOTE — Progress Notes (Signed)
50 y.o. G50P3003 Married Other or two or more races female here for annual exam.      Regular menses every 28 days, lasting 5 days. No hot flashes.   Patient's last menstrual period was 04/17/2020 (exact date).          Sexually active: Yes.    The current method of family planning is vasectomy.    Exercising: Yes.    yoga, sculpting, walking Smoker:  no  Health Maintenance: Pap:  11-30-2018 ASCUS HPV HR neg History of abnormal Pap:  no MMG:  2018 Colonoscopy:  none BMD:   none TDaP:  2014 Gardasil:   n/a Covid-19: 2 moderna & pfizer booster Hep C testing: not done Screening Labs: Done last year, essentially normal   reports that she has never smoked. She has never used smokeless tobacco. She reports current alcohol use of about 7.0 standard drinks of alcohol per week. She reports that she does not use drugs.  Past Medical History:  Diagnosis Date  . Allergic rhinitis   . Anxiety   . GERD (gastroesophageal reflux disease)   . Insomnia   . Migraines    with aura  . PMS (premenstrual syndrome)   . Substance abuse (HCC)    HSV    Past Surgical History:  Procedure Laterality Date  . BREAST SURGERY     breast enhancement    Current Outpatient Medications  Medication Sig Dispense Refill  . hydrOXYzine (ATARAX/VISTARIL) 10 MG tablet Take by mouth.    Marland Kitchen tiZANidine (ZANAFLEX) 4 MG tablet TAKE ONE TABLET AT BEDTIME.    . valACYclovir (VALTREX) 1000 MG tablet Take 2 tablets (2000mg ) po q 12 h x 1 day 30 tablet 1   No current facility-administered medications for this visit.    Family History  Problem Relation Age of Onset  . Depression Mother   . Stroke Maternal Grandmother   . Heart attack Paternal Grandfather     Review of Systems  Constitutional: Negative.   HENT: Negative.   Eyes: Negative.   Respiratory: Negative.   Cardiovascular: Negative.   Gastrointestinal: Negative.   Endocrine: Negative.   Genitourinary: Negative.   Musculoskeletal: Negative.    Skin: Negative.   Allergic/Immunologic: Negative.   Neurological: Negative.   Hematological: Negative.   Psychiatric/Behavioral: Negative.     Exam:   BP 112/70   Pulse 68   Resp 16   Ht 5' 8.25" (1.734 m)   Wt 131 lb (59.4 kg)   LMP 04/17/2020 (Exact Date)   BMI 19.77 kg/m   Height: 5' 8.25" (173.4 cm)  General appearance: alert, cooperative and appears stated age, no acute distress Head: Normocephalic, without obvious abnormality Neck: no adenopathy, thyroid normal to inspection and palpation Lungs: clear to auscultation bilaterally Breasts: No axillary or supraclavicular adenopathy, Normal to palpation without dominant masses Breast implants Heart: regular rate and rhythm Abdomen: soft, non-tender; no masses,  no organomegaly Extremities: extremities normal, no edema Skin: No rashes or lesions Lymph nodes: Cervical, supraclavicular, and axillary nodes normal. No abnormal inguinal nodes palpated Neurologic: Grossly normal   Pelvic: External genitalia:  no lesions              Urethra:  normal appearing urethra with no masses, tenderness or lesions              Bartholins and Skenes: normal                 Vagina: normal appearing vagina, appropriate for age, normal appearing  discharge, no lesions              Cervix: neg cervical motion tenderness, no visible lesions             Bimanual Exam:   Uterus:  normal size, contour, position, consistency, mobility, non-tender              Adnexa: no mass, fullness, tenderness   Anus: peri anal pedunculated skin tag vs condyloma                 Joy, CMA Chaperone was present for exam.  A/P:  Well Woman with normal exam  Well woman exam with routine gynecological exam  HSV-1 (herpes simplex virus 1) infection - Plan: valACYclovir (VALTREX) 1000 MG tablet  Skin lesion   Pap :cotesting done 2020 (ASCUS/HRHPV neg), next cotest  due 2023  Mammogram:to be scheduled, [facility] given [facility].  Pt to schedule ASAP  Labs: Screening labs done last year, essentially normal,  recheck in 1-2 years  Medications: Valtrex 2 g q12 h x 1 day   Pt to RTC for removal of skin lesion. Pt does not want to send to pathology.

## 2020-05-02 ENCOUNTER — Other Ambulatory Visit: Payer: Self-pay

## 2020-05-02 ENCOUNTER — Encounter: Payer: Self-pay | Admitting: Nurse Practitioner

## 2020-05-02 ENCOUNTER — Ambulatory Visit (INDEPENDENT_AMBULATORY_CARE_PROVIDER_SITE_OTHER): Payer: No Typology Code available for payment source | Admitting: Nurse Practitioner

## 2020-05-02 VITALS — BP 112/70 | HR 68 | Resp 16 | Ht 68.25 in | Wt 131.0 lb

## 2020-05-02 DIAGNOSIS — L989 Disorder of the skin and subcutaneous tissue, unspecified: Secondary | ICD-10-CM | POA: Diagnosis not present

## 2020-05-02 DIAGNOSIS — Z01419 Encounter for gynecological examination (general) (routine) without abnormal findings: Secondary | ICD-10-CM

## 2020-05-02 DIAGNOSIS — B009 Herpesviral infection, unspecified: Secondary | ICD-10-CM

## 2020-05-02 MED ORDER — VALACYCLOVIR HCL 1 G PO TABS: ORAL_TABLET | ORAL | 1 refills | Status: DC

## 2020-05-02 NOTE — Patient Instructions (Signed)
Health Maintenance, Female Adopting a healthy lifestyle and getting preventive care are important in promoting health and wellness. Ask your health care provider about:  The right schedule for you to have regular tests and exams.  Things you can do on your own to prevent diseases and keep yourself healthy. What should I know about diet, weight, and exercise? Eat a healthy diet  Eat a diet that includes plenty of vegetables, fruits, low-fat dairy products, and lean protein.  Do not eat a lot of foods that are high in solid fats, added sugars, or sodium.   Maintain a healthy weight Body mass index (BMI) is used to identify weight problems. It estimates body fat based on height and weight. Your health care provider can help determine your BMI and help you achieve or maintain a healthy weight. Get regular exercise Get regular exercise. This is one of the most important things you can do for your health. Most adults should:  Exercise for at least 150 minutes each week. The exercise should increase your heart rate and make you sweat (moderate-intensity exercise).  Do strengthening exercises at least twice a week. This is in addition to the moderate-intensity exercise.  Spend less time sitting. Even light physical activity can be beneficial. Watch cholesterol and blood lipids Have your blood tested for lipids and cholesterol at 48 years of age, then have this test every 5 years. Have your cholesterol levels checked more often if:  Your lipid or cholesterol levels are high.  You are older than 48 years of age.  You are at high risk for heart disease. What should I know about cancer screening? Depending on your health history and family history, you may need to have cancer screening at various ages. This may include screening for:  Breast cancer.  Cervical cancer.  Colorectal cancer.  Skin cancer.  Lung cancer. What should I know about heart disease, diabetes, and high blood  pressure? Blood pressure and heart disease  High blood pressure causes heart disease and increases the risk of stroke. This is more likely to develop in people who have high blood pressure readings, are of African descent, or are overweight.  Have your blood pressure checked: ? Every 3-5 years if you are 18-39 years of age. ? Every year if you are 40 years old or older. Diabetes Have regular diabetes screenings. This checks your fasting blood sugar level. Have the screening done:  Once every three years after age 40 if you are at a normal weight and have a low risk for diabetes.  More often and at a younger age if you are overweight or have a high risk for diabetes. What should I know about preventing infection? Hepatitis B If you have a higher risk for hepatitis B, you should be screened for this virus. Talk with your health care provider to find out if you are at risk for hepatitis B infection. Hepatitis C Testing is recommended for:  Everyone born from 1945 through 1965.  Anyone with known risk factors for hepatitis C. Sexually transmitted infections (STIs)  Get screened for STIs, including gonorrhea and chlamydia, if: ? You are sexually active and are younger than 48 years of age. ? You are older than 48 years of age and your health care provider tells you that you are at risk for this type of infection. ? Your sexual activity has changed since you were last screened, and you are at increased risk for chlamydia or gonorrhea. Ask your health care provider   if you are at risk.  Ask your health care provider about whether you are at high risk for HIV. Your health care provider may recommend a prescription medicine to help prevent HIV infection. If you choose to take medicine to prevent HIV, you should first get tested for HIV. You should then be tested every 3 months for as long as you are taking the medicine. Pregnancy  If you are about to stop having your period (premenopausal) and  you may become pregnant, seek counseling before you get pregnant.  Take 400 to 800 micrograms (mcg) of folic acid every day if you become pregnant.  Ask for birth control (contraception) if you want to prevent pregnancy. Osteoporosis and menopause Osteoporosis is a disease in which the bones lose minerals and strength with aging. This can result in bone fractures. If you are 65 years old or older, or if you are at risk for osteoporosis and fractures, ask your health care provider if you should:  Be screened for bone loss.  Take a calcium or vitamin D supplement to lower your risk of fractures.  Be given hormone replacement therapy (HRT) to treat symptoms of menopause. Follow these instructions at home: Lifestyle  Do not use any products that contain nicotine or tobacco, such as cigarettes, e-cigarettes, and chewing tobacco. If you need help quitting, ask your health care provider.  Do not use street drugs.  Do not share needles.  Ask your health care provider for help if you need support or information about quitting drugs. Alcohol use  Do not drink alcohol if: ? Your health care provider tells you not to drink. ? You are pregnant, may be pregnant, or are planning to become pregnant.  If you drink alcohol: ? Limit how much you use to 0-1 drink a day. ? Limit intake if you are breastfeeding.  Be aware of how much alcohol is in your drink. In the [place] one drink equals one 12 oz bottle of beer (355 mL), one 5 oz glass of wine (148 mL), or one 1 oz glass of hard liquor (44 mL). General instructions  Schedule regular health, dental, and eye exams.  Stay current with your vaccines.  Tell your health care provider if: ? You often feel depressed. ? You have ever been abused or do not feel safe at home. Summary  Adopting a healthy lifestyle and getting preventive care are important in promoting health and wellness.  Follow your health care provider's instructions about healthy  diet, exercising, and getting tested or screened for diseases.  Follow your health care provider's instructions on monitoring your cholesterol and blood pressure. This information is not intended to replace advice given to you by your health care provider. Make sure you discuss any questions you have with your health care provider. Document Revised: 04/01/2018 Document Reviewed: 04/01/2018 Elsevier Patient Education  2021 Elsevier Inc.  

## 2020-05-04 NOTE — Progress Notes (Signed)
GYNECOLOGY  VISIT  CC:   Skin tag removal  HPI: 50 y.o. [id number] Married Other or two or more races female here for removal of skin lesion.     GYNECOLOGIC HISTORY: Patient's last menstrual period was 04/17/2020 (exact date). Contraception: vasectomy Menopausal hormone therapy: none  Patient Active Problem List   Diagnosis Date Noted  . Fever blister 05/09/2012  . Diastasis recti 03/26/2012  . Visit for preventive health examination 05/01/2011  . Abdominal hernia 06/08/2010  . ADJ DISORDER WITH MIXED ANXIETY & DEPRESSED MOOD 07/06/2009  . PMS 08/18/2007  . ALLERGIC RHINITIS 11/20/2006  . Persistent insomnia 11/20/2006    Past Medical History:  Diagnosis Date  . Allergic rhinitis   . Anxiety   . GERD (gastroesophageal reflux disease)   . Insomnia   . Migraines    with aura  . PMS (premenstrual syndrome)   . Substance abuse (HCC)    HSV    Past Surgical History:  Procedure Laterality Date  . BREAST SURGERY     breast enhancement    MEDS:   Current Outpatient Medications on File Prior to Visit  Medication Sig Dispense Refill  . hydrOXYzine (ATARAX/VISTARIL) 10 MG tablet Take by mouth.    Marland Kitchen tiZANidine (ZANAFLEX) 4 MG tablet TAKE ONE TABLET AT BEDTIME.    . valACYclovir (VALTREX) 1000 MG tablet Take 2 tablets (2000mg ) po q 12 h x 1 day 30 tablet 1   No current facility-administered medications on file prior to visit.    ALLERGIES: Patient has no known allergies.  Family History  Problem Relation Age of Onset  . Depression Mother   . Stroke Maternal Grandmother   . Heart attack Paternal Grandfather      Review of Systems  PHYSICAL EXAMINATION:    Wt 133 lb (60.3 kg)   LMP 04/17/2020 (Exact Date)   BMI 20.07 kg/m     Chaperone, Joy, CMA, was present for exam.   04/19/2020 4 mm pedunculated Skin lesion identified in perianal area Betadine applied x 3, 0.1cc 1% lidocaine injected at site, stalk of lesion cut with scissors. Skin lesion not sent to pathology  per request of patient. Silver nitrate applied, minimal bleeding Gauze placed against site, not secured with tape r/t location of lesion  Assessment: Skin lesion removal  Plan: Excision of skin lesion after care instructions reviewed and included on AVS May apply Vaseline to protect area Call if any problems

## 2020-05-05 ENCOUNTER — Ambulatory Visit (INDEPENDENT_AMBULATORY_CARE_PROVIDER_SITE_OTHER): Payer: No Typology Code available for payment source | Admitting: Nurse Practitioner

## 2020-05-05 ENCOUNTER — Other Ambulatory Visit: Payer: Self-pay

## 2020-05-05 ENCOUNTER — Encounter: Payer: Self-pay | Admitting: Nurse Practitioner

## 2020-05-05 VITALS — BP 108/68 | HR 68 | Resp 16 | Wt 133.0 lb

## 2020-05-05 DIAGNOSIS — L989 Disorder of the skin and subcutaneous tissue, unspecified: Secondary | ICD-10-CM | POA: Diagnosis not present

## 2020-05-05 NOTE — Patient Instructions (Signed)
Excision of Skin Lesions, Care After This sheet gives you information about how to care for yourself after your procedure. Your health care provider may also give you more specific instructions. If you have problems or questions, contact your health care provider. What can I expect after the procedure? After your procedure, it is common to have pain or discomfort at the excision site. Follow these instructions at home: Excision care  Follow instructions from your health care provider about how to take care of your excision site.  Check the excision area every day for signs of infection. Watch for: ? Redness, swelling, or pain. ? Fluid or blood. ? Warmth. ? Pus or a bad smell.  Keep the site clean, dry, and protected for at least 48 hours.  For bleeding, apply gentle but firm pressure to the area using a folded towel for 20 minutes.    General instructions  Take over-the-counter and prescription medicines only as told by your health care provider.  Follow instructions from your health care provider about how to minimize scarring. Scarring should lessen over time.  Avoid sun exposure until the area has healed. Use sunscreen to protect the area from the sun after it has healed.  Keep all follow-up visits as told by your health care provider. This is important. Contact a health care provider if:  You have redness, swelling, or pain around your excision site.  You have fluid or blood coming from your excision site.  Your excision site feels warm to the touch.  You have pus or a bad smell coming from your excision site.  You have a fever.  You have pain that does not improve in 2-3 days after your procedure.  You notice skin irregularities or changes in how you feel (sensation). Summary  This sheet of instructions provides you with information about caring for yourself after your procedure. Contact your health care provider if you have any problems or questions.  Take  over-the-counter and prescription medicines only as told by your health care provider.  Change your dressing as told by your health care provider.  Contact a health care provider if you have redness, swelling, pain, or other signs of infection around your excision site.  Keep all follow-up visits as told by your health care provider. This is important. This information is not intended to replace advice given to you by your health care provider. Make sure you discuss any questions you have with your health care provider. Document Revised: 10/15/2017 Document Reviewed: 10/15/2017 Elsevier Patient Education  2021 [employer].

## 2020-05-19 ENCOUNTER — Encounter: Payer: Self-pay | Admitting: Nurse Practitioner

## 2021-04-12 NOTE — Progress Notes (Signed)
[facility] [address] [place] [address] Phone: [phone] Subjective:   Sharon Mercado, am serving as a scribe for Dr. Antoine Mercado. This visit occurred during the SARS-CoV-2 public health emergency.  Safety protocols were in place, including screening questions prior to the visit, additional usage of staff PPE, and extensive cleaning of exam room while observing appropriate contact time as indicated for disinfecting solutions.  I'm seeing this patient by the request  of:  Sharon Mercado, Vyvyan, MD  CC: low back pain   [id number]  Sharon Mercado is a 50 y.o. female coming in with complaint of chronic LBP. Pain on both sides of her back. Exercising too much increases her pain. Stretches to alleviate pain. Glute med and hamstrings can be paniful at times. Does use Zanaflex at night.     Past Medical History:  Diagnosis Date   Allergic rhinitis    Anxiety    GERD (gastroesophageal reflux disease)    Insomnia    Migraines    with aura   PMS (premenstrual syndrome)    Substance abuse (HCC)    HSV   Past Surgical History:  Procedure Laterality Date   BREAST SURGERY     breast enhancement   Social History   Socioeconomic History   Marital status: Married    Spouse name: Not on file   Number of children: Not on file   Years of education: Not on file   Highest education level: Not on file  Occupational History   Not on file  Tobacco Use   Smoking status: Never   Smokeless tobacco: Never  Substance and Sexual Activity   Alcohol use: Yes    Alcohol/week: 7.0 standard drinks    Types: 7 Standard drinks or equivalent per week   Drug use: No   Sexual activity: Yes    Partners: Male    Comment: husband vasectomy  Other Topics Concern   Not on file  Social History Narrative   Non smoker    married with children    Homemaker HHof 5  And dog   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   tiZANidine (ZANAFLEX) 4 MG tablet, TAKE ONE TABLET AT BEDTIME.   valACYclovir (VALTREX) 1000 MG tablet, Take 2 tablets (2000mg ) po q 12 h x 1 day   Reviewed prior external information including notes and imaging from  primary care provider As well as notes that were available from care everywhere and other healthcare systems.  Past medical history, social, surgical and family history all reviewed in electronic medical record.  No pertanent information unless stated regarding to the chief complaint.   Review of Systems:  No headache, visual changes, nausea, vomiting, diarrhea, constipation, dizziness, abdominal pain, skin rash, fevers, chills, night sweats,  weight loss, swollen lymph nodes, body aches, joint swelling, chest pain, shortness of breath, mood changes. POSITIVE muscle aches  Objective  Blood pressure 106/70, pulse 70, height 5' 8.25" (1.734 m), weight 140 lb (63.5 kg), SpO2 98 %.   General: No apparent distress alert and oriented x3 mood and affect normal, dressed appropriately.  HEENT: Pupils equal, extraocular movements intact  Respiratory: Patient's speak in full sentences and does not appear short of breath  Cardiovascular: No lower extremity edema, non tender, no erythema  Gait normal with good balance and coordination.  MSK: Mild hypermobility noted.  Tenderness to palpation over the right sacroiliac joint.  Patient does have tenderness to  palpation Mild loss of lordosis of the lumbar spine.  Tender to palpation in this area.  Patient has mild tightness noted with FABER test right greater than left.  Osteopathic findings T6 extended rotated and side bent right L5 flexed rotated and side bent left Sacrum right on right   Impression and Recommendations:     The above documentation has been reviewed and is accurate and complete Judi Saa, DO

## 2021-04-17 ENCOUNTER — Other Ambulatory Visit: Payer: Self-pay

## 2021-04-17 ENCOUNTER — Ambulatory Visit: Payer: 59 | Admitting: Family Medicine

## 2021-04-17 ENCOUNTER — Encounter: Payer: Self-pay | Admitting: Family Medicine

## 2021-04-17 VITALS — BP 106/70 | HR 70 | Ht 68.25 in | Wt 140.0 lb

## 2021-04-17 DIAGNOSIS — M9902 Segmental and somatic dysfunction of thoracic region: Secondary | ICD-10-CM | POA: Diagnosis not present

## 2021-04-17 DIAGNOSIS — M545 Low back pain, unspecified: Secondary | ICD-10-CM | POA: Diagnosis not present

## 2021-04-17 DIAGNOSIS — M9904 Segmental and somatic dysfunction of sacral region: Secondary | ICD-10-CM | POA: Diagnosis not present

## 2021-04-17 DIAGNOSIS — M9903 Segmental and somatic dysfunction of lumbar region: Secondary | ICD-10-CM | POA: Diagnosis not present

## 2021-04-17 NOTE — Assessment & Plan Note (Signed)
Home exercises given.  Discussed icing regimen.  Discussed which activities to do which wants to avoid.  Increase activity slowly.  Discussed posture and ergonomics.  Patient does do a lot of of yoga so we discussed different hip abductor strengthening that I think will be beneficial.  Follow-up again in 6 weeks

## 2021-04-17 NOTE — Assessment & Plan Note (Signed)

## 2021-04-17 NOTE — Patient Instructions (Signed)
Good to see you Vit D 2000IU daily Xray lumbar spine See me in 6 weeks

## 2021-05-29 ENCOUNTER — Ambulatory Visit: Payer: 59 | Admitting: Family Medicine

## 2021-06-25 ENCOUNTER — Other Ambulatory Visit: Payer: Self-pay

## 2021-06-25 ENCOUNTER — Ambulatory Visit (INDEPENDENT_AMBULATORY_CARE_PROVIDER_SITE_OTHER): Payer: BC Managed Care – PPO | Admitting: Nurse Practitioner

## 2021-06-25 ENCOUNTER — Other Ambulatory Visit (HOSPITAL_COMMUNITY)
Admission: RE | Admit: 2021-06-25 | Discharge: 2021-06-25 | Disposition: A | Discharge status: Home / Self Care | Payer: BC Managed Care – PPO | Source: Ambulatory Visit | Attending: Nurse Practitioner | Admitting: Nurse Practitioner

## 2021-06-25 ENCOUNTER — Encounter: Payer: Self-pay | Admitting: Nurse Practitioner

## 2021-06-25 VITALS — BP 120/70 | Ht 67.5 in | Wt 132.0 lb

## 2021-06-25 DIAGNOSIS — B009 Herpesviral infection, unspecified: Secondary | ICD-10-CM | POA: Diagnosis not present

## 2021-06-25 DIAGNOSIS — Z01419 Encounter for gynecological examination (general) (routine) without abnormal findings: Secondary | ICD-10-CM | POA: Diagnosis not present

## 2021-06-25 DIAGNOSIS — G4700 Insomnia, unspecified: Secondary | ICD-10-CM

## 2021-06-25 DIAGNOSIS — Z1211 Encounter for screening for malignant neoplasm of colon: Secondary | ICD-10-CM | POA: Diagnosis not present

## 2021-06-25 MED ORDER — VALACYCLOVIR HCL 1 G PO TABS: 1000.0000 mg | ORAL_TABLET | Freq: Two times a day (BID) | ORAL | 1 refills | Status: DC

## 2021-06-25 MED ORDER — ESZOPICLONE 1 MG PO TABS: 1.0000 mg | ORAL_TABLET | Freq: Every evening | ORAL | 0 refills | Status: DC | PRN

## 2021-06-25 NOTE — Progress Notes (Signed)
? ?Sharon Mercado 1970/07/31 [id number] ? ? ?History:  51 y.o. [id number] presents for annual exam. Monthly cycles. Normal pap history. HSV 1, takes Valtrex as needed. Complains of trouble sleeping. She has been using hydroxyzine but feels it causes brain fog. She has also tried Ambien years ago as well as amitriptyline. She has started a new job. She has a great sleep hygiene routine. She is schedule to see [facility] next month to discuss bioidentical hormones. ? ?Gynecologic History ?Patient's last menstrual period was 06/18/2021. ?Period Cycle (Days): 28 ?Period Duration (Days): 4 ?Period Pattern: Regular ?Menstrual Flow: Moderate ?Dysmenorrhea: (!) Mild ?Dysmenorrhea Symptoms: Cramping ?Contraception/Family planning: vasectomy ?Sexually active: Yes ? ?Health Maintenance ?Last Pap: 11/30/2018. Results were: ASCUS negative HPV ?Last mammogram: 05/19/2020. Results were: Normal ?Last colonoscopy: Never ?Last Dexa: Not indicated ? ?Past medical history, past surgical history, family history and social history were all reviewed and documented in the EPIC chart. Married. 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 3 children.  ? ?ROS:  A ROS was performed and pertinent positives and negatives are included. ? ?Exam: ? ?Vitals:  ? 06/25/21 1611  ?BP: 120/70  ?Weight: 132 lb (59.9 kg)  ?Height: 5' 7.5" (1.715 m)  ? ?Body mass index is 20.37 kg/m?. ? ?General appearance:  Normal ?Thyroid:  Symmetrical, normal in size, without palpable masses or nodularity. ?Respiratory ? Auscultation:  Clear without wheezing or rhonchi ?Cardiovascular ? Auscultation:  Regular rate, without rubs, murmurs or gallops ? Edema/varicosities:  Not grossly evident ?Abdominal ? Soft,nontender, without masses, guarding or rebound. ? Liver/spleen:  No organomegaly noted ? Hernia:  None appreciated ? Skin ? Inspection:  Grossly normal ?Breasts: Examined lying and sitting. Bilateral implants noted.   ? Right: Without masses, retractions, nipple discharge or axillary adenopathy. ? ? Left: Without masses, retractions, nipple discharge or axillary adenopathy. ?Genitourinary  ? Inguinal/mons:  Normal without inguinal adenopathy ? External genitalia:  Normal appearing vulva with no masses, tenderness, or lesions ? BUS/Urethra/Skene's glands:  Normal ? Vagina:  Normal appearing with normal color and discharge, no lesions ? Cervix:  Normal appearing without discharge or lesions ? Uterus:  Normal in size, shape and contour.  Midline and mobile, nontender ? Adnexa/parametria:   ?  Rt: Normal in size, without masses or tenderness. ?  Lt: Normal in size, without masses or tenderness. ? Anus and perineum: Normal ? Digital rectal exam: Normal sphincter tone without palpated masses or tenderness ? ?Patient informed chaperone available to be present for breast and pelvic exam. Patient has requested no chaperone to be present. Patient has been advised what will be completed during breast and pelvic exam.  ? ?Assessment/Plan:  51 y.o. [id number] for annual exam.  ? ?Well female exam with routine gynecological exam - Plan: Cytology - PAP( [facility]). Education provided on SBEs, importance of preventative screenings, current guidelines, high calcium diet, regular exercise, and multivitamin daily. Plans to get labs with [facility].  ? ?Insomnia, unspecified type - Plan: eszopiclone (LUNESTA) 1 MG TABS tablet immediately before bedtime. #30 provided. Will follow up with PCP for continued refills if appropriate.  ? ?HSV-1 infection - Plan: valACYclovir (VALTREX) 1000 MG tablet BID x 3-5 days at first sign of outbreak. ? ?Screening for colon cancer - Plan: Cologuard. Discussed current guidelines and importance of preventative screenings. She does not have any personal risk factors or family history of colon cancer. She is good candidate for cologuard and would like to do this.  ? ?Screening for  cervical cancer - Normal Pap history. Pap  today.  ? ?Screening for breast cancer - Normal mammogram history.  Continue annual screenings.  Normal breast exam today. ? ?Return in 1 year for annual.  ? ? ? ?Olivia Mackie DNP, 4:54 PM 06/25/2021 ? ?

## 2021-06-26 ENCOUNTER — Telehealth: Payer: Self-pay | Admitting: *Deleted

## 2021-06-26 DIAGNOSIS — Z1231 Encounter for screening mammogram for malignant neoplasm of breast: Secondary | ICD-10-CM | POA: Diagnosis not present

## 2021-06-26 DIAGNOSIS — B009 Herpesviral infection, unspecified: Secondary | ICD-10-CM

## 2021-06-26 NOTE — Telephone Encounter (Signed)
PA done via cover my meds for Valtrex 1000 mg tablet and Lunesta 1 mg tablet waiting response from [facility]. ?

## 2021-06-28 ENCOUNTER — Ambulatory Visit: Payer: 59 | Admitting: Family Medicine

## 2021-06-28 LAB — CYTOLOGY - PAP
Comment: NEGATIVE
Diagnosis: NEGATIVE
High risk HPV: NEGATIVE

## 2021-06-29 ENCOUNTER — Encounter: Payer: Self-pay | Admitting: Nurse Practitioner

## 2021-06-29 NOTE — Telephone Encounter (Signed)
PA response was denied insurance will pay for Vatlrex 1,000 mg 30 tablets per 30 days. Note attached stating medication is not covered for valtrex 1,000 #30  tablet per 15 days.  ? ?Tiffany I think the confusion is the 2 set of directions on Rx that say 1 po twice day and then 1 po bid x 3-5 days at first sign of outbreak. I think if a new Rx with directions 1 po bid for 3-5 days at first sign of outbreak insurance may cover Rx. Okay to send new Rx with these directions? ? ?Please advise  ?

## 2021-07-02 MED ORDER — VALACYCLOVIR HCL 1 G PO TABS: ORAL_TABLET | ORAL | 1 refills | Status: DC

## 2021-07-02 NOTE — Telephone Encounter (Signed)
Yes, please send. Thank you.  

## 2021-07-02 NOTE — Telephone Encounter (Signed)
New Rx sent with below directions.  ?

## 2021-07-05 NOTE — Telephone Encounter (Signed)
PA for eszopiclone 1 mg tablet approved (#30 tablet per 30 days)  ?

## 2021-07-24 NOTE — Progress Notes (Signed)
?[name] ?[facility] ?[address] [place] [address] ?Phone: [phone] ?Subjective:   ?I, Sharon Mercado, am serving as a scribe for Dr. Antoine Primas. ? ?This visit occurred during the SARS-CoV-2 public health emergency.  Safety protocols were in place, including screening questions prior to the visit, additional usage of staff PPE, and extensive cleaning of exam room while observing appropriate contact time as indicated for disinfecting solutions.  ? ? ?I'm seeing this patient by the request  of:  [name], MD ? ?CC: Back pain follow-up ? ?[id number]  ?Sharon Mercado is a 51 y.o. female coming in with complaint of back and neck pain. OMT 04/17/2021. Patient states that she is having lower back pain that is not radiating. Did get better but got worse with a twisting motion.  ? ?Medications patient has been prescribed: None ? ?Taking: ? ? ?  ? ? ? ? ?Reviewed prior external information including notes and imaging from previsou exam, outside providers and external EMR if available.  ? ?As well as notes that were available from care everywhere and other healthcare systems. ? ?Past medical history, social, surgical and family history all reviewed in electronic medical record.  No pertanent information unless stated regarding to the chief complaint.  ? ?Past Medical History:  ?Diagnosis Date  ? Allergic rhinitis   ? Anxiety   ? GERD (gastroesophageal reflux disease)   ? Insomnia   ? Migraines   ? with aura  ? PMS (premenstrual syndrome)   ?  ?No Known Allergies ? ? ?Review of Systems: ? No headache, visual changes, nausea, vomiting, diarrhea, constipation, dizziness, abdominal pain, skin rash, fevers, chills, night sweats, weight loss, swollen lymph nodes, body aches, joint swelling, chest pain, shortness of breath, mood changes. POSITIVE muscle aches ? ?Objective  ?Blood pressure 100/78, pulse 63, height 5' 7.5" (1.715 m), weight 133 lb 12.8 oz (60.7 kg), last menstrual period  07/05/2021, SpO2 99 %. ?  ?General: No apparent distress alert and oriented x3 mood and affect normal, dressed appropriately.  ?HEENT: Pupils equal, extraocular movements intact  ?Respiratory: Patient's speak in full sentences and does not appear short of breath  ?Cardiovascular: No lower extremity edema, non tender, no erythema  ?Low back exam does have some loss of lordosis.  Some tenderness to palpation of the paraspinal musculature. ? ?Osteopathic findings ? ?C2 flexed rotated and side bent right ?C6 flexed rotated and side bent left ?T3 extended rotated and side bent right inhaled rib ?T9 extended rotated and side bent left ?L2 flexed rotated and side bent right ?Sacrum right on right ? ? ? ? ?  ?Assessment and Plan: ? ?Degenerative disc disease, lumbar ?Mild mostly at L5-S1.  No significant radicular symptoms.  Responding extremely well though to osteopathic manipulation.  Has a muscle relaxer for any breakthrough.  Discussed core strengthening exercises.  Discussed avoiding repetitive back extension.  Follow-up again in 6 to 8 weeks  ? ?Nonallopathic problems ? ?Decision today to treat with OMT was based on Physical Exam ? ?After verbal consent patient was treated with HVLA, ME, FPR techniques in cervical, rib, thoracic, lumbar, and sacral  areas ? ?Patient tolerated the procedure well with improvement in symptoms ? ?Patient given exercises, stretches and lifestyle modifications ? ?See medications in patient instructions if given ? ?Patient will follow up in 4-8 weeks ? ?  ? ?The above documentation has been reviewed and is accurate and complete Judi Saa, DO ? ? ? ?  ? ?  Note: This dictation was prepared with Dragon dictation along with smaller phrase technology. Any transcriptional errors that result from this process are unintentional.    ?  ?  ? ?

## 2021-07-25 ENCOUNTER — Encounter: Payer: Self-pay | Admitting: Family Medicine

## 2021-07-25 ENCOUNTER — Ambulatory Visit (INDEPENDENT_AMBULATORY_CARE_PROVIDER_SITE_OTHER): Payer: BC Managed Care – PPO | Admitting: Family Medicine

## 2021-07-25 ENCOUNTER — Ambulatory Visit (INDEPENDENT_AMBULATORY_CARE_PROVIDER_SITE_OTHER): Payer: BC Managed Care – PPO

## 2021-07-25 VITALS — BP 100/78 | HR 63 | Ht 67.5 in | Wt 133.8 lb

## 2021-07-25 DIAGNOSIS — M51369 Other intervertebral disc degeneration, lumbar region without mention of lumbar back pain or lower extremity pain: Secondary | ICD-10-CM

## 2021-07-25 DIAGNOSIS — M545 Low back pain, unspecified: Secondary | ICD-10-CM

## 2021-07-25 DIAGNOSIS — M9901 Segmental and somatic dysfunction of cervical region: Secondary | ICD-10-CM | POA: Diagnosis not present

## 2021-07-25 DIAGNOSIS — M5136 Other intervertebral disc degeneration, lumbar region: Secondary | ICD-10-CM

## 2021-07-25 DIAGNOSIS — M9902 Segmental and somatic dysfunction of thoracic region: Secondary | ICD-10-CM | POA: Diagnosis not present

## 2021-07-25 DIAGNOSIS — M9904 Segmental and somatic dysfunction of sacral region: Secondary | ICD-10-CM | POA: Diagnosis not present

## 2021-07-25 DIAGNOSIS — M9903 Segmental and somatic dysfunction of lumbar region: Secondary | ICD-10-CM

## 2021-07-25 DIAGNOSIS — M9908 Segmental and somatic dysfunction of rib cage: Secondary | ICD-10-CM | POA: Diagnosis not present

## 2021-07-25 DIAGNOSIS — M5137 Other intervertebral disc degeneration, lumbosacral region: Secondary | ICD-10-CM | POA: Diagnosis not present

## 2021-07-25 NOTE — Assessment & Plan Note (Signed)
Mild mostly at L5-S1.  No significant radicular symptoms.  Responding extremely well though to osteopathic manipulation.  Has a muscle relaxer for any breakthrough.  Discussed core strengthening exercises.  Discussed avoiding repetitive back extension.  Follow-up again in 6 to 8 weeks ?

## 2021-07-25 NOTE — Patient Instructions (Signed)
Avoid repetitive extension ?Come down on gas pedal instead of reaching ?See me in 6-8 weeks ?

## 2021-08-07 DIAGNOSIS — R5383 Other fatigue: Secondary | ICD-10-CM | POA: Diagnosis not present

## 2021-08-07 DIAGNOSIS — E349 Endocrine disorder, unspecified: Secondary | ICD-10-CM | POA: Diagnosis not present

## 2021-08-07 DIAGNOSIS — E559 Vitamin D deficiency, unspecified: Secondary | ICD-10-CM | POA: Diagnosis not present

## 2021-08-07 DIAGNOSIS — Z131 Encounter for screening for diabetes mellitus: Secondary | ICD-10-CM | POA: Diagnosis not present

## 2021-08-07 DIAGNOSIS — N943 Premenstrual tension syndrome: Secondary | ICD-10-CM | POA: Diagnosis not present

## 2021-08-07 DIAGNOSIS — E538 Deficiency of other specified B group vitamins: Secondary | ICD-10-CM | POA: Diagnosis not present

## 2021-08-07 DIAGNOSIS — G4700 Insomnia, unspecified: Secondary | ICD-10-CM | POA: Diagnosis not present

## 2021-08-27 DIAGNOSIS — E349 Endocrine disorder, unspecified: Secondary | ICD-10-CM | POA: Diagnosis not present

## 2021-08-27 DIAGNOSIS — N943 Premenstrual tension syndrome: Secondary | ICD-10-CM | POA: Diagnosis not present

## 2021-09-04 NOTE — Progress Notes (Signed)
  [facility] [address] [place] [address] Phone: [phone] Subjective:   Sharon Mercado, am serving as a scribe for Dr. Antoine Primas. This visit occurred during the SARS-CoV-2 public health emergency.  Safety protocols were in place, including screening questions prior to the visit, additional usage of staff PPE, and extensive cleaning of exam room while observing appropriate contact time as indicated for disinfecting solutions.   I'm seeing this patient by the request  of:  Sun, Vyvyan, MD  CC: Neck and back pain follow-up  [id number]  Sharon Mercado is a 51 y.o. female coming in with complaint of back and neck pain. OMT on 07/25/2021. Patient states doing better. No new complaints.  Medications patient has been prescribed: Zanaflex  Taking:      Reviewed prior external information including notes and imaging from previsou exam, outside providers and external EMR if available.   As well as notes that were available from care everywhere and other healthcare systems.  Past medical history, social, surgical and family history all reviewed in electronic medical record.  No pertanent information unless stated regarding to the chief complaint.   Past Medical History:  Diagnosis Date   Allergic rhinitis    Anxiety    GERD (gastroesophageal reflux disease)    Insomnia    Migraines    with aura   PMS (premenstrual syndrome)     No Known Allergies   Review of Systems:  No headache, visual changes, nausea, vomiting, diarrhea, constipation, dizziness, abdominal pain, skin rash, fevers, chills, night sweats, weight loss, swollen lymph nodes, body aches, joint swelling, chest pain, shortness of breath, mood changes. POSITIVE muscle aches  Objective  Blood pressure 116/72, pulse 75, height 5\' 7"  (1.702 m), weight 133 lb (60.3 kg), SpO2 99 %.   General: No apparent distress alert and oriented x3 mood and affect normal, dressed  appropriately.  HEENT: Pupils equal, extraocular movements intact  Respiratory: Patient's speak in full sentences and does not appear short of breath  Cardiovascular: No lower extremity edema, non tender, no erythema  Low back exam does have some mild loss of lordosis tightness with FABER, negative SLT   Osteopathic findings   T8 extended rotated and side bent left L1 flexed rotated and side bent right Sacrum right on right     Assessment and Plan:  Degenerative disc disease, lumbar Chronic, patient is responding extremely well to osteopathic manipulation, core strengthening and staying active.  Patient will follow-up with me again in 8 weeks    Nonallopathic problems  Decision today to treat with OMT was based on Physical Exam  After verbal consent patient was treated with HVLA, ME, FPR techniques in cervical, rib, thoracic, lumbar, and sacral  areas  Patient tolerated the procedure well with improvement in symptoms  Patient given exercises, stretches and lifestyle modifications  See medications in patient instructions if given  Patient will follow up in 4-8 weeks will be some      The above documentation has been reviewed and is accurate and complete , DO        Note: This dictation was prepared with Dragon dictation along with smaller phrase technology. Any transcriptional errors that result from this process are unintentional.

## 2021-09-05 ENCOUNTER — Ambulatory Visit (INDEPENDENT_AMBULATORY_CARE_PROVIDER_SITE_OTHER): Payer: BC Managed Care – PPO | Admitting: Family Medicine

## 2021-09-05 VITALS — BP 116/72 | HR 75 | Ht 67.0 in | Wt 133.0 lb

## 2021-09-05 DIAGNOSIS — M5136 Other intervertebral disc degeneration, lumbar region: Secondary | ICD-10-CM

## 2021-09-05 DIAGNOSIS — M9904 Segmental and somatic dysfunction of sacral region: Secondary | ICD-10-CM | POA: Diagnosis not present

## 2021-09-05 DIAGNOSIS — M9903 Segmental and somatic dysfunction of lumbar region: Secondary | ICD-10-CM

## 2021-09-05 DIAGNOSIS — M9902 Segmental and somatic dysfunction of thoracic region: Secondary | ICD-10-CM

## 2021-09-05 NOTE — Patient Instructions (Signed)
Good to see you! ?Enjoy your daughters visit ?See you again in 2 months ?

## 2021-09-06 NOTE — Assessment & Plan Note (Signed)
Chronic, patient is responding extremely well to osteopathic manipulation, core strengthening and staying active.  Patient will follow-up with me again in 8 weeks

## 2021-09-10 DIAGNOSIS — R5383 Other fatigue: Secondary | ICD-10-CM | POA: Diagnosis not present

## 2021-09-10 DIAGNOSIS — G4700 Insomnia, unspecified: Secondary | ICD-10-CM | POA: Diagnosis not present

## 2021-09-10 DIAGNOSIS — E349 Endocrine disorder, unspecified: Secondary | ICD-10-CM | POA: Diagnosis not present

## 2021-09-10 DIAGNOSIS — N943 Premenstrual tension syndrome: Secondary | ICD-10-CM | POA: Diagnosis not present

## 2021-09-18 DIAGNOSIS — N943 Premenstrual tension syndrome: Secondary | ICD-10-CM | POA: Diagnosis not present

## 2021-09-18 DIAGNOSIS — G4700 Insomnia, unspecified: Secondary | ICD-10-CM | POA: Diagnosis not present

## 2021-09-18 DIAGNOSIS — Z1329 Encounter for screening for other suspected endocrine disorder: Secondary | ICD-10-CM | POA: Diagnosis not present

## 2021-09-18 DIAGNOSIS — R5383 Other fatigue: Secondary | ICD-10-CM | POA: Diagnosis not present

## 2021-09-18 DIAGNOSIS — E349 Endocrine disorder, unspecified: Secondary | ICD-10-CM | POA: Diagnosis not present

## 2021-10-09 ENCOUNTER — Encounter: Payer: Self-pay | Admitting: Nurse Practitioner

## 2021-10-15 ENCOUNTER — Encounter: Payer: Self-pay | Admitting: Nurse Practitioner

## 2021-10-15 ENCOUNTER — Ambulatory Visit (INDEPENDENT_AMBULATORY_CARE_PROVIDER_SITE_OTHER): Payer: BC Managed Care – PPO | Admitting: Nurse Practitioner

## 2021-10-15 VITALS — BP 122/76

## 2021-10-15 DIAGNOSIS — F3281 Premenstrual dysphoric disorder: Secondary | ICD-10-CM

## 2021-10-15 DIAGNOSIS — F419 Anxiety disorder, unspecified: Secondary | ICD-10-CM

## 2021-10-15 MED ORDER — SERTRALINE HCL 25 MG PO TABS: 25.0000 mg | ORAL_TABLET | Freq: Every day | ORAL | 1 refills | Status: DC

## 2021-10-15 NOTE — Progress Notes (Signed)
   Acute Office Visit  Subjective:    Patient ID: Sharon Mercado, female    DOB: May 03, 1970, 51 y.o.   MRN: [id number]   HPI 51 y.o. presents today for PMS symptoms. She has always struggled with PMS but reports symptoms are worsening. She experiences insomnia, mood changes, anxiety, and fatigue with menses. She has tried many sleep aids with no improvement and has a good sleep hygiene routine. She saw functional medicine provider a couple of months ago with normal female hormone panel, subclinical hypothyroidism - started on NP thyroid, started on testosterone gel, and supplemented for B12 and Vitamin D. She reports some improvement in symptoms but mostly with fatigue and cold intolerance. She was on Zoloft in the past and did well. She has H/O migraine with aura with COC use.    Review of Systems  Constitutional:  Positive for fatigue.  Genitourinary: Negative.   Psychiatric/Behavioral:  Positive for dysphoric mood and sleep disturbance. The patient is nervous/anxious.        Objective:    Physical Exam Constitutional:      Appearance: Normal appearance.  Psychiatric:        Attention and Perception: Attention normal.        Mood and Affect: Mood is anxious.        Speech: Speech normal.        Behavior: Behavior normal.        Cognition and Memory: Cognition normal.   GU: Not indicated  BP 122/76   LMP 09/28/2021  Wt Readings from Last 3 Encounters:  09/05/21 133 lb (60.3 kg)  07/25/21 133 lb 12.8 oz (60.7 kg)  06/25/21 132 lb (59.9 kg)        Patient informed chaperone available to be present for breast and/or pelvic exam. Patient has requested no chaperone to be present. Patient has been advised what will be completed during breast and pelvic exam.   Assessment & Plan:   Problem List Items Addressed This Visit   None Visit Diagnoses     PMDD (premenstrual dysphoric disorder)    -  Primary   Relevant Medications   sertraline (ZOLOFT) 25 MG tablet   Anxiety        Relevant Medications   sertraline (ZOLOFT) 25 MG tablet      Plan: Will avoid estrogen due to H/O migraine with aura with COC use. Will start Zoloft 25 mg daily - she is aware of initial side effects, will increase as tolerated. Continue magnesium supplement and may add topical progesterone gel. Aware correction of thyroid hormone and nutritional deficiencies can take time. All questions answered.      Olivia Mackie DNP, 4:21 PM 10/15/2021

## 2021-10-15 NOTE — Telephone Encounter (Signed)
Appointments patient would like to schedule visit with TW. Please reach out to patient.

## 2021-10-26 DIAGNOSIS — M26629 Arthralgia of temporomandibular joint, unspecified side: Secondary | ICD-10-CM | POA: Diagnosis not present

## 2021-10-26 DIAGNOSIS — G4700 Insomnia, unspecified: Secondary | ICD-10-CM | POA: Diagnosis not present

## 2021-11-02 NOTE — Progress Notes (Deleted)
  [name] [address] [place] [address] Phone: [phone] Subjective:    I'm seeing this patient by the request  of:  [name], MD  CC:   [id number]  Sharon Mercado is a 51 y.o. female coming in with complaint of back and neck pain. OMT 09/05/2021. Patient states   Medications patient has been prescribed: None  Taking:         Reviewed prior external information including notes and imaging from previsou exam, outside providers and external EMR if available.   As well as notes that were available from care everywhere and other healthcare systems.  Past medical history, social, surgical and family history all reviewed in electronic medical record.  No pertanent information unless stated regarding to the chief complaint.   Past Medical History:  Diagnosis Date   Allergic rhinitis    Anxiety    GERD (gastroesophageal reflux disease)    Insomnia    Migraines    with aura   PMS (premenstrual syndrome)     No Known Allergies   Review of Systems:  No headache, visual changes, nausea, vomiting, diarrhea, constipation, dizziness, abdominal pain, skin rash, fevers, chills, night sweats, weight loss, swollen lymph nodes, body aches, joint swelling, chest pain, shortness of breath, mood changes. POSITIVE muscle aches  Objective  Last menstrual period 09/28/2021.   General: No apparent distress alert and oriented x3 mood and affect normal, dressed appropriately.  HEENT: Pupils equal, extraocular movements intact  Respiratory: Patient's speak in full sentences and does not appear short of breath  Cardiovascular: No lower extremity edema, non tender, no erythema  Gait MSK:  Back   Osteopathic findings  C2 flexed rotated and side bent right C6 flexed rotated and side bent left T3 extended rotated and side bent right inhaled rib T9 extended rotated and side bent left L2 flexed rotated and side bent right Sacrum right on  right       Assessment and Plan:  No problem-specific Assessment & Plan notes found for this encounter.    Nonallopathic problems  Decision today to treat with OMT was based on Physical Exam  After verbal consent patient was treated with HVLA, ME, FPR techniques in cervical, rib, thoracic, lumbar, and sacral  areas  Patient tolerated the procedure well with improvement in symptoms  Patient given exercises, stretches and lifestyle modifications  See medications in patient instructions if given  Patient will follow up in 4-8 weeks             Note: This dictation was prepared with Dragon dictation along with smaller phrase technology. Any transcriptional errors that result from this process are unintentional.

## 2021-11-06 ENCOUNTER — Ambulatory Visit: Payer: BC Managed Care – PPO | Admitting: Family Medicine

## 2021-11-11 ENCOUNTER — Other Ambulatory Visit: Payer: Self-pay | Admitting: Nurse Practitioner

## 2021-11-11 DIAGNOSIS — G4700 Insomnia, unspecified: Secondary | ICD-10-CM

## 2021-11-12 ENCOUNTER — Other Ambulatory Visit: Payer: Self-pay | Admitting: Nurse Practitioner

## 2021-11-12 DIAGNOSIS — G4700 Insomnia, unspecified: Secondary | ICD-10-CM

## 2021-11-12 NOTE — Telephone Encounter (Signed)
Left message for patient to call and clarify if she requested this Rx.

## 2021-11-12 NOTE — Telephone Encounter (Signed)
It appears Rx was D/C'd ON 10/15/21 office visit.

## 2021-11-13 DIAGNOSIS — N943 Premenstrual tension syndrome: Secondary | ICD-10-CM | POA: Diagnosis not present

## 2021-11-13 DIAGNOSIS — G4700 Insomnia, unspecified: Secondary | ICD-10-CM | POA: Diagnosis not present

## 2021-11-13 DIAGNOSIS — R5383 Other fatigue: Secondary | ICD-10-CM | POA: Diagnosis not present

## 2021-11-13 DIAGNOSIS — E349 Endocrine disorder, unspecified: Secondary | ICD-10-CM | POA: Diagnosis not present

## 2021-11-15 NOTE — Telephone Encounter (Signed)
It appears patient saw a [facility] provider on 11/12/21 for insomnia. Patient has not called back to answer question.

## 2021-11-15 NOTE — Telephone Encounter (Signed)
Patient reported to me that this was not helping at all and she planned to follow up with PCP.

## 2021-11-15 NOTE — Telephone Encounter (Signed)
Looks like neurology switched her to something else. Will deny refill.

## 2021-12-20 DIAGNOSIS — E349 Endocrine disorder, unspecified: Secondary | ICD-10-CM | POA: Diagnosis not present

## 2021-12-20 DIAGNOSIS — G4700 Insomnia, unspecified: Secondary | ICD-10-CM | POA: Diagnosis not present

## 2021-12-20 DIAGNOSIS — R5383 Other fatigue: Secondary | ICD-10-CM | POA: Diagnosis not present

## 2021-12-20 DIAGNOSIS — F418 Other specified anxiety disorders: Secondary | ICD-10-CM | POA: Diagnosis not present

## 2021-12-20 DIAGNOSIS — N943 Premenstrual tension syndrome: Secondary | ICD-10-CM | POA: Diagnosis not present

## 2022-01-09 DIAGNOSIS — R5383 Other fatigue: Secondary | ICD-10-CM | POA: Diagnosis not present

## 2022-01-09 DIAGNOSIS — E349 Endocrine disorder, unspecified: Secondary | ICD-10-CM | POA: Diagnosis not present

## 2022-01-17 NOTE — Progress Notes (Signed)
[place] Phone: [phone] Subjective:   [name], am serving as a scribe for Dr. Hulan Saas.  I'm seeing this patient by the request  of:  Sun, Vyvyan, MD  CC: Back and neck pain follow-up  [id number]  Sharon Mercado is a 51 y.o. female coming in with complaint of back and neck pain. OMT 09/05/2021. Patient states that she missed her last appointment and she is having more pain in lower L side of lumbar spine. No radiating symptoms.   Medications patient has been prescribed: None  Taking:         Reviewed prior external information including notes and imaging from previsou exam, outside providers and external EMR if available.   As well as notes that were available from care everywhere and other healthcare systems.  Past medical history, social, surgical and family history all reviewed in electronic medical record.  No pertanent information unless stated regarding to the chief complaint.   Past Medical History:  Diagnosis Date   Allergic rhinitis    Anxiety    GERD (gastroesophageal reflux disease)    Insomnia    Migraines    with aura   PMS (premenstrual syndrome)     No Known Allergies   Review of Systems:  No headache, visual changes, nausea, vomiting, diarrhea, constipation, dizziness, abdominal pain, skin rash, fevers, chills, night sweats, weight loss, swollen lymph nodes, body aches, joint swelling, chest pain, shortness of breath, mood changes. POSITIVE muscle aches  Objective  Blood pressure 96/72, pulse 66, height 5\' 7"  (1.702 m), weight 132 lb (59.9 kg), SpO2 99 %.   General: No apparent distress alert and oriented x3 mood and affect normal, dressed appropriately.  HEENT: Pupils equal, extraocular movements intact  Respiratory: Patient's speak in full sentences and does not appear short of breath  Cardiovascular: No lower extremity edema, non tender, no erythema   Hypermobility noted.  Patient does lack the last 5 degrees of flexion in the last 10 degrees of extension of the lower back.  Patient does have tightness noted more around the sacroiliac joint right greater than left.  Osteopathic findings  C2 flexed rotated and side bent right C5 flexed rotated and side bent left T3 extended rotated and side bent right inhaled rib T8 extended rotated and side bent left L2 flexed rotated and side bent right Sacrum right on right       Assessment and Plan:  Degenerative disc disease, lumbar Know ddd, has had some difficulty.  Has been 5 months since we have seen patient.  Discussed with patient icing regimen and home exercises.  We discussed continuing to be active would be better.  Patient does have some mild hypermobility will continue to monitor and discussed some strengthening.  Patient does have Zanaflex for breakthrough.  Follow-up with me again in 4 to 6 weeks    Nonallopathic problems  Decision today to treat with OMT was based on Physical Exam  After verbal consent patient was treated with HVLA, ME, FPR techniques in cervical, rib, thoracic, lumbar, and sacral  areas  Patient tolerated the procedure well with improvement in symptoms  Patient given exercises, stretches and lifestyle modifications  See medications in patient instructions if given  Patient will follow up in 4-8 weeks     The above documentation has been reviewed and is accurate and complete Lyndal Pulley, DO         Note: This  dictation was prepared with Dragon dictation along with smaller phrase technology. Any transcriptional errors that result from this process are unintentional.

## 2022-01-23 ENCOUNTER — Ambulatory Visit (INDEPENDENT_AMBULATORY_CARE_PROVIDER_SITE_OTHER): Payer: BC Managed Care – PPO | Admitting: Family Medicine

## 2022-01-23 VITALS — BP 96/72 | HR 66 | Ht 67.0 in | Wt 132.0 lb

## 2022-01-23 DIAGNOSIS — M9908 Segmental and somatic dysfunction of rib cage: Secondary | ICD-10-CM | POA: Diagnosis not present

## 2022-01-23 DIAGNOSIS — M9902 Segmental and somatic dysfunction of thoracic region: Secondary | ICD-10-CM

## 2022-01-23 DIAGNOSIS — M9901 Segmental and somatic dysfunction of cervical region: Secondary | ICD-10-CM

## 2022-01-23 DIAGNOSIS — M9904 Segmental and somatic dysfunction of sacral region: Secondary | ICD-10-CM

## 2022-01-23 DIAGNOSIS — M5136 Other intervertebral disc degeneration, lumbar region: Secondary | ICD-10-CM | POA: Diagnosis not present

## 2022-01-23 DIAGNOSIS — M9903 Segmental and somatic dysfunction of lumbar region: Secondary | ICD-10-CM

## 2022-01-23 NOTE — Patient Instructions (Addendum)
We will get back on the schedule Can't wait to see the Barbie costume ;) See me again in 4-5 weeks

## 2022-01-23 NOTE — Assessment & Plan Note (Signed)
Know ddd, has had some difficulty.  Has been 5 months since we have seen patient.  Discussed with patient icing regimen and home exercises.  We discussed continuing to be active would be better.  Patient does have some mild hypermobility will continue to monitor and discussed some strengthening.  Patient does have Zanaflex for breakthrough.  Follow-up with me again in 4 to 6 weeks

## 2022-01-30 ENCOUNTER — Ambulatory Visit: Payer: BC Managed Care – PPO | Admitting: Family Medicine

## 2022-02-14 NOTE — Progress Notes (Signed)
  [place] Phone: [phone] Subjective:    I'm seeing this patient by the request  of:  Donald Prose, MD  CC: Back and neck pain follow-up  [id number]  Sharon Mercado is a 51 y.o. female coming in with complaint of back and neck pain. OMT 01/23/2022. Patient states back is doing pretty well. States low back is tight  Medications patient has been prescribed: None  Taking:         Reviewed prior external information including notes and imaging from previsou exam, outside providers and external EMR if available.   As well as notes that were available from care everywhere and other healthcare systems.  Past medical history, social, surgical and family history all reviewed in electronic medical record.  No pertanent information unless stated regarding to the chief complaint.   Past Medical History:  Diagnosis Date   Allergic rhinitis    Anxiety    GERD (gastroesophageal reflux disease)    Insomnia    Migraines    with aura   PMS (premenstrual syndrome)     No Known Allergies   Review of Systems:  No headache, visual changes, nausea, vomiting, diarrhea, constipation, dizziness, abdominal pain, skin rash, fevers, chills, night sweats, weight loss, swollen lymph nodes, body aches, joint swelling, chest pain, shortness of breath, mood changes. POSITIVE muscle aches  Objective  Blood pressure 100/60, pulse 81, height 5\' 7"  (1.702 m), weight 132 lb (59.9 kg), SpO2 99 %.   General: No apparent distress alert and oriented x3 mood and affect normal, dressed appropriately.  HEENT: Pupils equal, extraocular movements intact  Respiratory: Patient's speak in full sentences and does not appear short of breath  Cardiovascular: No lower extremity edema, non tender, no erythema  Gait MSK:  Back have some loss of lordosis.  Some tenderness to palpation of the paraspinal musculature.  Osteopathic findings  C2  flexed rotated and side bent right C7 flexed rotated and side bent left T3 extended rotated and side bent right inhaled rib T8 extended rotated and side bent left L3 flexed rotated and side bent left Sacrum right on right       Assessment and Plan:  Degenerative disc disease, lumbar Chronic, stable.  Continue to stay active otherwise.  Discussed icing regimen and home exercises, discussed which activities to do and which ones to avoid.  Patient does have Zanaflex 4 mg to take at night if needed.  Patient knows to call if any radicular symptoms does occur.  Follow-up with me again in 6 to 8 weeks otherwise.    Nonallopathic problems  Decision today to treat with OMT was based on Physical Exam  After verbal consent patient was treated with HVLA, ME, FPR techniques in cervical, rib, thoracic, lumbar, and sacral  areas  Patient tolerated the procedure well with improvement in symptoms  Patient given exercises, stretches and lifestyle modifications  See medications in patient instructions if given  Patient will follow up in 4-8 weeks     The above documentation has been reviewed and is accurate and complete Lyndal Pulley, DO         Note: This dictation was prepared with Dragon dictation along with smaller phrase technology. Any transcriptional errors that result from this process are unintentional.

## 2022-02-18 ENCOUNTER — Ambulatory Visit (INDEPENDENT_AMBULATORY_CARE_PROVIDER_SITE_OTHER): Payer: BC Managed Care – PPO | Admitting: Family Medicine

## 2022-02-18 ENCOUNTER — Encounter: Payer: Self-pay | Admitting: Family Medicine

## 2022-02-18 VITALS — BP 100/60 | HR 81 | Ht 67.0 in | Wt 132.0 lb

## 2022-02-18 DIAGNOSIS — M9908 Segmental and somatic dysfunction of rib cage: Secondary | ICD-10-CM | POA: Diagnosis not present

## 2022-02-18 DIAGNOSIS — M9903 Segmental and somatic dysfunction of lumbar region: Secondary | ICD-10-CM

## 2022-02-18 DIAGNOSIS — M9904 Segmental and somatic dysfunction of sacral region: Secondary | ICD-10-CM | POA: Diagnosis not present

## 2022-02-18 DIAGNOSIS — M5136 Other intervertebral disc degeneration, lumbar region: Secondary | ICD-10-CM

## 2022-02-18 DIAGNOSIS — M9901 Segmental and somatic dysfunction of cervical region: Secondary | ICD-10-CM | POA: Diagnosis not present

## 2022-02-18 DIAGNOSIS — M9902 Segmental and somatic dysfunction of thoracic region: Secondary | ICD-10-CM

## 2022-02-18 NOTE — Assessment & Plan Note (Signed)
Chronic, stable.  Continue to stay active otherwise.  Discussed icing regimen and home exercises, discussed which activities to do and which ones to avoid.  Patient does have Zanaflex 4 mg to take at night if needed.  Patient knows to call if any radicular symptoms does occur.  Follow-up with me again in 6 to 8 weeks otherwise.

## 2022-02-18 NOTE — Patient Instructions (Addendum)
Good to see you  Ankle exercises given Heel lift might be good too Follow up in 8 weeks

## 2022-03-05 DIAGNOSIS — L814 Other melanin hyperpigmentation: Secondary | ICD-10-CM | POA: Diagnosis not present

## 2022-03-05 DIAGNOSIS — D2271 Melanocytic nevi of right lower limb, including hip: Secondary | ICD-10-CM | POA: Diagnosis not present

## 2022-03-05 DIAGNOSIS — L578 Other skin changes due to chronic exposure to nonionizing radiation: Secondary | ICD-10-CM | POA: Diagnosis not present

## 2022-03-05 DIAGNOSIS — B078 Other viral warts: Secondary | ICD-10-CM | POA: Diagnosis not present

## 2022-03-05 DIAGNOSIS — L0100 Impetigo, unspecified: Secondary | ICD-10-CM | POA: Diagnosis not present

## 2022-03-12 DIAGNOSIS — N951 Menopausal and female climacteric states: Secondary | ICD-10-CM | POA: Diagnosis not present

## 2022-03-21 NOTE — Progress Notes (Signed)
[name] [address] [place] [address] Phone: [phone] Subjective:    I'm seeing this patient by the request  of:  [name], MD  CC: Back and neck pain follow-up  [id number]  Sharon Mercado is a 51 y.o. female coming in with complaint of back and neck pain. OMT 01/22/2022. Patient states here for routine checkup at the moment.  Patient states that she has been doing relatively well but has some discomfort.  Not stopping her from activity still.  Medications patient has been prescribed: None  Taking:         Reviewed prior external information including notes and imaging from previsou exam, outside providers and external EMR if available.   As well as notes that were available from care everywhere and other healthcare systems.  Past medical history, social, surgical and family history all reviewed in electronic medical record.  No pertanent information unless stated regarding to the chief complaint.   Past Medical History:  Diagnosis Date   Allergic rhinitis    Anxiety    GERD (gastroesophageal reflux disease)    Insomnia    Migraines    with aura   PMS (premenstrual syndrome)     No Known Allergies   Review of Systems:  No headache, visual changes, nausea, vomiting, diarrhea, constipation, dizziness, abdominal pain, skin rash, fevers, chills, night sweats, weight loss, swollen lymph nodes, body aches, joint swelling, chest pain, shortness of breath, mood changes. POSITIVE muscle aches  Objective  Blood pressure 110/70, pulse 65, height 5\' 7"  (1.702 m), SpO2 98 %.   General: No apparent distress alert and oriented x3 mood and affect normal, dressed appropriately.  HEENT: Pupils equal, extraocular movements intact  Respiratory: Patient's speak in full sentences and does not appear short of breath  Cardiovascular: No lower extremity edema, non tender, no erythema  Gait MSK:  Back low back does have some mild loss  of lordosis.  Still has some limited sidebending bilaterally.  Patient does have some difficulty with the right sacroiliac joint.  Osteopathic findings  C2 flexed rotated and side bent right C6 flexed rotated and side bent left T3 extended rotated and side bent right inhaled rib T9 extended rotated and side bent left L2 flexed rotated and side bent right Sacrum right on right       Assessment and Plan:  Degenerative disc disease, lumbar Chronic problem with doing relatively well with the conservative therapy at the moment.  Discussed continuing to work on core strengthening, discussed posture and ergonomics.  Discussed which activities to do and which ones to avoid.  Patient will not make any other significant changes.  Has Zanaflex for breakthrough.  Follow-up again in 6 to 8 weeks.    Nonallopathic problems  Decision today to treat with OMT was based on Physical Exam  After verbal consent patient was treated with HVLA, ME, FPR techniques in cervical, rib, thoracic, lumbar, and sacral  areas  Patient tolerated the procedure well with improvement in symptoms  Patient given exercises, stretches and lifestyle modifications  See medications in patient instructions if given  Patient will follow up in 4-8 weeks      The above documentation has been reviewed and is accurate and complete , DO        Note: This dictation was prepared with Dragon dictation along with smaller phrase technology. Any transcriptional errors that result from this process are unintentional.

## 2022-03-22 ENCOUNTER — Ambulatory Visit (INDEPENDENT_AMBULATORY_CARE_PROVIDER_SITE_OTHER): Payer: BC Managed Care – PPO | Admitting: Family Medicine

## 2022-03-22 VITALS — BP 110/70 | HR 65 | Ht 67.0 in

## 2022-03-22 DIAGNOSIS — M9903 Segmental and somatic dysfunction of lumbar region: Secondary | ICD-10-CM

## 2022-03-22 DIAGNOSIS — M9902 Segmental and somatic dysfunction of thoracic region: Secondary | ICD-10-CM

## 2022-03-22 DIAGNOSIS — M9901 Segmental and somatic dysfunction of cervical region: Secondary | ICD-10-CM

## 2022-03-22 DIAGNOSIS — M5136 Other intervertebral disc degeneration, lumbar region: Secondary | ICD-10-CM

## 2022-03-22 DIAGNOSIS — M9908 Segmental and somatic dysfunction of rib cage: Secondary | ICD-10-CM

## 2022-03-22 DIAGNOSIS — M9904 Segmental and somatic dysfunction of sacral region: Secondary | ICD-10-CM | POA: Diagnosis not present

## 2022-03-22 NOTE — Patient Instructions (Signed)
Great to see you  Happy Holliday's  See me again in 6 weeks

## 2022-03-22 NOTE — Assessment & Plan Note (Signed)
Chronic problem with doing relatively well with the conservative therapy at the moment.  Discussed continuing to work on core strengthening, discussed posture and ergonomics.  Discussed which activities to do and which ones to avoid.  Patient will not make any other significant changes.  Has Zanaflex for breakthrough.  Follow-up again in 6 to 8 weeks.

## 2022-03-24 ENCOUNTER — Other Ambulatory Visit: Payer: Self-pay | Admitting: Nurse Practitioner

## 2022-03-24 DIAGNOSIS — B009 Herpesviral infection, unspecified: Secondary | ICD-10-CM

## 2022-03-25 ENCOUNTER — Other Ambulatory Visit: Payer: Self-pay | Admitting: Nurse Practitioner

## 2022-03-25 DIAGNOSIS — F3281 Premenstrual dysphoric disorder: Secondary | ICD-10-CM

## 2022-03-25 DIAGNOSIS — F419 Anxiety disorder, unspecified: Secondary | ICD-10-CM

## 2022-03-25 NOTE — Telephone Encounter (Signed)
RX request for Zoloft 25 mg Last annual 06/25/2021 Routing to Provider for approval

## 2022-03-26 NOTE — Telephone Encounter (Signed)
Medication refill request: Valtrex Last AEX:  06/25/21 Next AEX: none  Last MMG (if hormonal medication request): n/a Refill authorized: #[id number] with 1 refill

## 2022-03-28 DIAGNOSIS — R7989 Other specified abnormal findings of blood chemistry: Secondary | ICD-10-CM | POA: Diagnosis not present

## 2022-03-28 DIAGNOSIS — E039 Hypothyroidism, unspecified: Secondary | ICD-10-CM | POA: Diagnosis not present

## 2022-03-28 DIAGNOSIS — E349 Endocrine disorder, unspecified: Secondary | ICD-10-CM | POA: Diagnosis not present

## 2022-03-28 DIAGNOSIS — E559 Vitamin D deficiency, unspecified: Secondary | ICD-10-CM | POA: Diagnosis not present

## 2022-04-23 DIAGNOSIS — Z13220 Encounter for screening for lipoid disorders: Secondary | ICD-10-CM | POA: Diagnosis not present

## 2022-04-23 DIAGNOSIS — Z0000 Encounter for general adult medical examination without abnormal findings: Secondary | ICD-10-CM | POA: Diagnosis not present

## 2022-04-23 DIAGNOSIS — E039 Hypothyroidism, unspecified: Secondary | ICD-10-CM | POA: Diagnosis not present

## 2022-04-23 DIAGNOSIS — Z23 Encounter for immunization: Secondary | ICD-10-CM | POA: Diagnosis not present

## 2022-04-30 NOTE — Progress Notes (Signed)
  [facility] [place] Phone: [phone] Subjective:   Sharon Mercado, am serving as a scribe for Dr. Hulan Saas. I'm seeing this patient by the request  of:  Donald Prose, MD  CC: Degenerative disc disease.  [id number]  Sharon Mercado is a 52 y.o. female coming in with complaint of back and neck pain. OMT 03/22/2022. Patient states normal low back pain.  Patient states that it has been doing a new Pilates class seems to be causing some of the aggravation.    Medications patient has been prescribed: None  Taking:         Reviewed prior external information including notes and imaging from previsou exam, outside providers and external EMR if available.   As well as notes that were available from care everywhere and other healthcare systems.  Past medical history, social, surgical and family history all reviewed in electronic medical record.  No pertanent information unless stated regarding to the chief complaint.   Past Medical History:  Diagnosis Date   Allergic rhinitis    Anxiety    GERD (gastroesophageal reflux disease)    Insomnia    Migraines    with aura   PMS (premenstrual syndrome)     No Known Allergies   Review of Systems:  No headache, visual changes, nausea, vomiting, diarrhea, constipation, dizziness, abdominal pain, skin rash, fevers, chills, night sweats, weight loss, swollen lymph nodes, body aches, joint swelling, chest pain, shortness of breath, mood changes. POSITIVE muscle aches  Objective  Blood pressure 104/60, pulse 70, height 5\' 7"  (1.702 m), SpO2 98 %.   General: No apparent distress alert and oriented x3 mood and affect normal, dressed appropriately.  HEENT: Pupils equal, extraocular movements intact  Respiratory: Patient's speak in full sentences and does not appear short of breath  Cardiovascular: No lower extremity edema, non tender, no erythema  Low back exam does have  tightness noted.  Some tenderness to palpation in the paraspinal musculature.  Patient does have some  Osteopathic findings  C6 flexed rotated and side bent left T3 extended rotated and side bent right inhaled rib T6 extended rotated and side bent left L1 flexed rotated and side bent right Sacrum right on right       Assessment and Plan:  Degenerative disc disease, lumbar Low back exam does have some loss of lordosis.  Some tenderness to palpation in the paraspinal musculature.  Patient has responded relatively well to osteopathic manipulation.  Encourage patient to continue to be active.  Will follow-up again in 6 to 8 weeks.    Nonallopathic problems  Decision today to treat with OMT was based on Physical Exam  After verbal consent patient was treated with HVLA, ME, FPR techniques in cervical, rib, thoracic, lumbar, and sacral  areas  Patient tolerated the procedure well with improvement in symptoms  Patient given exercises, stretches and lifestyle modifications  See medications in patient instructions if given  Patient will follow up in 4-8 weeks    The above documentation has been reviewed and is accurate and complete Lyndal Pulley, DO          Note: This dictation was prepared with Dragon dictation along with smaller phrase technology. Any transcriptional errors that result from this process are unintentional.

## 2022-05-03 ENCOUNTER — Ambulatory Visit (INDEPENDENT_AMBULATORY_CARE_PROVIDER_SITE_OTHER): Payer: BC Managed Care – PPO | Admitting: Family Medicine

## 2022-05-03 VITALS — BP 104/60 | HR 70 | Ht 67.0 in

## 2022-05-03 DIAGNOSIS — M9903 Segmental and somatic dysfunction of lumbar region: Secondary | ICD-10-CM | POA: Diagnosis not present

## 2022-05-03 DIAGNOSIS — M9904 Segmental and somatic dysfunction of sacral region: Secondary | ICD-10-CM | POA: Diagnosis not present

## 2022-05-03 DIAGNOSIS — M9901 Segmental and somatic dysfunction of cervical region: Secondary | ICD-10-CM | POA: Diagnosis not present

## 2022-05-03 DIAGNOSIS — M5136 Other intervertebral disc degeneration, lumbar region: Secondary | ICD-10-CM | POA: Diagnosis not present

## 2022-05-03 DIAGNOSIS — M9908 Segmental and somatic dysfunction of rib cage: Secondary | ICD-10-CM | POA: Diagnosis not present

## 2022-05-03 DIAGNOSIS — M9902 Segmental and somatic dysfunction of thoracic region: Secondary | ICD-10-CM

## 2022-05-03 NOTE — Patient Instructions (Signed)
Good to see you  Good luck with the class  See me in 4-6 weeks

## 2022-05-03 NOTE — Assessment & Plan Note (Signed)
Low back exam does have some loss of lordosis.  Some tenderness to palpation in the paraspinal musculature.  Patient has responded relatively well to osteopathic manipulation.  Encourage patient to continue to be active.  Will follow-up again in 6 to 8 weeks.

## 2022-05-27 ENCOUNTER — Other Ambulatory Visit: Payer: Self-pay | Admitting: Nurse Practitioner

## 2022-05-27 DIAGNOSIS — B009 Herpesviral infection, unspecified: Secondary | ICD-10-CM

## 2022-05-27 NOTE — Telephone Encounter (Signed)
AEX 06/25/21.

## 2022-05-30 NOTE — Progress Notes (Signed)
  [facility] [place] Phone: [phone] Subjective:    I'm seeing this patient by the request  of:  Donald Prose, MD  CC: Low back pain follow-up  [id number]  TAKEYSHA BONK is a 52 y.o. female coming in with complaint of back and neck pain. OMT on 05/03/2022. Patient states has been doing Pilates classes been doing more.  Patient states that the repetitive activity seems to aggravate it somewhat.  Medications patient has been prescribed:   Taking:         Reviewed prior external information including notes and imaging from previsou exam, outside providers and external EMR if available.   As well as notes that were available from care everywhere and other healthcare systems.  Past medical history, social, surgical and family history all reviewed in electronic medical record.  No pertanent information unless stated regarding to the chief complaint.   Past Medical History:  Diagnosis Date   Allergic rhinitis    Anxiety    GERD (gastroesophageal reflux disease)    Insomnia    Migraines    with aura   PMS (premenstrual syndrome)     No Known Allergies   Review of Systems:  No headache, visual changes, nausea, vomiting, diarrhea, constipation, dizziness, abdominal pain, skin rash, fevers, chills, night sweats, weight loss, swollen lymph nodes, body aches, joint swelling, chest pain, shortness of breath, mood changes. POSITIVE muscle aches  Objective  Blood pressure 100/62, pulse 60, height 5\' 7"  (1.702 m), weight 131 lb (59.4 kg), SpO2 98 %.   General: No apparent distress alert and oriented x3 mood and affect normal, dressed appropriately.  HEENT: Pupils equal, extraocular movements intact  Respiratory: Patient's speak in full sentences and does not appear short of breath  Cardiovascular: No lower extremity edema, non tender, no erythema  Low back has some tenderness in the paraspinal musculature patient  has tightness around the right sacroiliac joint again right.  Negative straight leg test.  Osteopathic findings  C2 flexed rotated and side bent right C6 flexed rotated and side bent left T3 extended rotated and side bent right inhaled rib T9 extended rotated and side bent left L3 flexed rotated and side bent left Sacrum right on right       Assessment and Plan:  Degenerative disc disease, lumbar Doing very well.  Any daily colitis That is likely contributing to some discussed icing regimen and exercises, discussed continuing core strength.  Patient will do well with conservative therapy I believe.  Follow-up with me again in 6 to 8 weeks    Nonallopathic problems  Decision today to treat with OMT was based on Physical Exam  After verbal consent patient was treated with HVLA, ME, FPR techniques in cervical, rib, thoracic, lumbar, and sacral  areas  Patient tolerated the procedure well with improvement in symptoms  Patient given exercises, stretches and lifestyle modifications  See medications in patient instructions if given  Patient will follow up in 4-8 weeks     The above documentation has been reviewed and is accurate and complete Lyndal Pulley, DO         Note: This dictation was prepared with Dragon dictation along with smaller phrase technology. Any transcriptional errors that result from this process are unintentional.

## 2022-05-31 ENCOUNTER — Ambulatory Visit (INDEPENDENT_AMBULATORY_CARE_PROVIDER_SITE_OTHER): Payer: BC Managed Care – PPO | Admitting: Family Medicine

## 2022-05-31 VITALS — BP 100/62 | HR 60 | Ht 67.0 in | Wt 131.0 lb

## 2022-05-31 DIAGNOSIS — M5136 Other intervertebral disc degeneration, lumbar region: Secondary | ICD-10-CM | POA: Diagnosis not present

## 2022-05-31 DIAGNOSIS — M9902 Segmental and somatic dysfunction of thoracic region: Secondary | ICD-10-CM

## 2022-05-31 DIAGNOSIS — M9903 Segmental and somatic dysfunction of lumbar region: Secondary | ICD-10-CM | POA: Diagnosis not present

## 2022-05-31 DIAGNOSIS — M9904 Segmental and somatic dysfunction of sacral region: Secondary | ICD-10-CM | POA: Diagnosis not present

## 2022-05-31 DIAGNOSIS — M9901 Segmental and somatic dysfunction of cervical region: Secondary | ICD-10-CM | POA: Diagnosis not present

## 2022-05-31 DIAGNOSIS — M9908 Segmental and somatic dysfunction of rib cage: Secondary | ICD-10-CM | POA: Diagnosis not present

## 2022-05-31 NOTE — Assessment & Plan Note (Signed)
Doing very well.  Any daily colitis That is likely contributing to some discussed icing regimen and exercises, discussed continuing core strength.  Patient will do well with conservative therapy I believe.  Follow-up with me again in 6 to 8 weeks

## 2022-05-31 NOTE — Patient Instructions (Addendum)
Good to see you  Eat with in 30 minutes of working out  Follow up in 4 weeks

## 2022-06-06 DIAGNOSIS — F418 Other specified anxiety disorders: Secondary | ICD-10-CM | POA: Diagnosis not present

## 2022-06-06 DIAGNOSIS — G4700 Insomnia, unspecified: Secondary | ICD-10-CM | POA: Diagnosis not present

## 2022-06-06 DIAGNOSIS — N951 Menopausal and female climacteric states: Secondary | ICD-10-CM | POA: Diagnosis not present

## 2022-06-06 DIAGNOSIS — R7989 Other specified abnormal findings of blood chemistry: Secondary | ICD-10-CM | POA: Diagnosis not present

## 2022-06-06 DIAGNOSIS — E559 Vitamin D deficiency, unspecified: Secondary | ICD-10-CM | POA: Diagnosis not present

## 2022-06-06 DIAGNOSIS — R5383 Other fatigue: Secondary | ICD-10-CM | POA: Diagnosis not present

## 2022-06-06 DIAGNOSIS — E039 Hypothyroidism, unspecified: Secondary | ICD-10-CM | POA: Diagnosis not present

## 2022-06-25 DIAGNOSIS — E039 Hypothyroidism, unspecified: Secondary | ICD-10-CM | POA: Diagnosis not present

## 2022-06-25 DIAGNOSIS — R7989 Other specified abnormal findings of blood chemistry: Secondary | ICD-10-CM | POA: Diagnosis not present

## 2022-06-25 DIAGNOSIS — E559 Vitamin D deficiency, unspecified: Secondary | ICD-10-CM | POA: Diagnosis not present

## 2022-06-25 DIAGNOSIS — E349 Endocrine disorder, unspecified: Secondary | ICD-10-CM | POA: Diagnosis not present

## 2022-06-28 NOTE — Progress Notes (Unsigned)
  [facility] [address] [place] Phone: [phone] Subjective:   Sharon Mercado, am serving as a scribe for Dr. Hulan Saas.  I'm seeing this patient by the request  of:  Sun, Vyvyan, MD  CC: back and neck pain follow up   [id number]  Sharon Mercado is a 52 y.o. female coming in with complaint of back and neck pain. OMT 05/31/2022.  Patient was to change some diet changes after working out.  Patient states doing better since last visit. No new concerns.  Medications patient has been prescribed: None  Taking:         Reviewed prior external information including notes and imaging from previsou exam, outside providers and external EMR if available.   As well as notes that were available from care everywhere and other healthcare systems.  Past medical history, social, surgical and family history all reviewed in electronic medical record.  No pertanent information unless stated regarding to the chief complaint.   Past Medical History:  Diagnosis Date   Allergic rhinitis    Anxiety    GERD (gastroesophageal reflux disease)    Insomnia    Migraines    with aura   PMS (premenstrual syndrome)     No Known Allergies   Review of Systems:  No headache, visual changes, nausea, vomiting, diarrhea, constipation, dizziness, abdominal pain, skin rash, fevers, chills, night sweats, weight loss, swollen lymph nodes, body aches, joint swelling, chest pain, shortness of breath, mood changes. POSITIVE muscle aches  Objective  Blood pressure 102/64, pulse 82, height 5\' 7"  (1.702 m), weight 136 lb (61.7 kg), SpO2 98 %.   General: No apparent distress alert and oriented x3 mood and affect normal, dressed appropriately.  HEENT: Pupils equal, extraocular movements intact  Respiratory: Patient's speak in full sentences and does not appear short of breath  Cardiovascular: No lower extremity edema, non tender, no erythema  MSK:  Back low  back does have some mild loss of lordosis noted.  Tightness noted in the sacroiliac joint bilaterally.  Osteopathic findings  C2 flexed rotated and side bent right C6 flexed rotated and side bent left T3 extended rotated and side bent right inhaled rib T9 extended rotated and side bent left L2 flexed rotated and side bent right Sacrum right on right       Assessment and Plan:  Degenerative disc disease, lumbar Patient is making some improvement overall.  Discussed posture and ergonomics otherwise, patient will be traveling and would like to see patient again in 2 months to make sure patient is responding.  Follow-up again at that time    Nonallopathic problems  Decision today to treat with OMT was based on Physical Exam  After verbal consent patient was treated with HVLA, ME, FPR techniques in cervical, rib, thoracic, lumbar, and sacral  areas  Patient tolerated the procedure well with improvement in symptoms  Patient given exercises, stretches and lifestyle modifications  See medications in patient instructions if given  Patient will follow up in 4-8 weeks     The above documentation has been reviewed and is accurate and complete Lyndal Pulley, DO         Note: This dictation was prepared with Dragon dictation along with smaller phrase technology. Any transcriptional errors that result from this process are unintentional.

## 2022-07-01 ENCOUNTER — Ambulatory Visit (INDEPENDENT_AMBULATORY_CARE_PROVIDER_SITE_OTHER): Payer: BC Managed Care – PPO | Admitting: Family Medicine

## 2022-07-01 VITALS — BP 102/64 | HR 82 | Ht 67.0 in | Wt 136.0 lb

## 2022-07-01 DIAGNOSIS — M9904 Segmental and somatic dysfunction of sacral region: Secondary | ICD-10-CM | POA: Diagnosis not present

## 2022-07-01 DIAGNOSIS — M5136 Other intervertebral disc degeneration, lumbar region: Secondary | ICD-10-CM

## 2022-07-01 DIAGNOSIS — M9903 Segmental and somatic dysfunction of lumbar region: Secondary | ICD-10-CM | POA: Diagnosis not present

## 2022-07-01 DIAGNOSIS — M9901 Segmental and somatic dysfunction of cervical region: Secondary | ICD-10-CM | POA: Diagnosis not present

## 2022-07-01 DIAGNOSIS — M9908 Segmental and somatic dysfunction of rib cage: Secondary | ICD-10-CM

## 2022-07-01 DIAGNOSIS — M9902 Segmental and somatic dysfunction of thoracic region: Secondary | ICD-10-CM | POA: Diagnosis not present

## 2022-07-01 NOTE — Assessment & Plan Note (Signed)
Patient is making some improvement overall.  Discussed posture and ergonomics otherwise, patient will be traveling and would like to see patient again in 2 months to make sure patient is responding.  Follow-up again at that time

## 2022-07-01 NOTE — Patient Instructions (Signed)
[place] Have a great time See you again in 2-3 months

## 2022-07-25 DIAGNOSIS — B078 Other viral warts: Secondary | ICD-10-CM | POA: Diagnosis not present

## 2022-07-26 DIAGNOSIS — Z1231 Encounter for screening mammogram for malignant neoplasm of breast: Secondary | ICD-10-CM | POA: Diagnosis not present

## 2022-07-29 ENCOUNTER — Encounter: Payer: Self-pay | Admitting: Nurse Practitioner

## 2022-08-01 ENCOUNTER — Encounter: Payer: Self-pay | Admitting: Nurse Practitioner

## 2022-08-01 ENCOUNTER — Ambulatory Visit (INDEPENDENT_AMBULATORY_CARE_PROVIDER_SITE_OTHER): Payer: BC Managed Care – PPO | Admitting: Nurse Practitioner

## 2022-08-01 VITALS — BP 120/74 | HR 62 | Ht 67.32 in | Wt 129.0 lb

## 2022-08-01 DIAGNOSIS — Z01419 Encounter for gynecological examination (general) (routine) without abnormal findings: Secondary | ICD-10-CM

## 2022-08-01 DIAGNOSIS — F3281 Premenstrual dysphoric disorder: Secondary | ICD-10-CM | POA: Diagnosis not present

## 2022-08-01 DIAGNOSIS — B009 Herpesviral infection, unspecified: Secondary | ICD-10-CM

## 2022-08-01 DIAGNOSIS — N951 Menopausal and female climacteric states: Secondary | ICD-10-CM

## 2022-08-01 DIAGNOSIS — F419 Anxiety disorder, unspecified: Secondary | ICD-10-CM | POA: Diagnosis not present

## 2022-08-01 MED ORDER — SERTRALINE HCL 25 MG PO TABS: 25.0000 mg | ORAL_TABLET | Freq: Every day | ORAL | 3 refills | Status: DC

## 2022-08-01 MED ORDER — VALACYCLOVIR HCL 1 G PO TABS: 500.0000 mg | ORAL_TABLET | Freq: Two times a day (BID) | ORAL | 1 refills | Status: DC

## 2022-08-01 NOTE — Progress Notes (Signed)
Sharon Mercado 20-Aug-1970 [id number]   History:  52 y.o. G3P3003 presents for annual exam. Cycles have become slightly irregular, skipping menses about every 3rd month. Started on Zoloft 09/2021 for PMDD, good management. Normal pap history.  HSV 1, takes Valtrex as needed. [facility] managing HRT - Prometrium, testosterone cream.   Gynecologic History Patient's last menstrual period was 07/16/2022. Period Duration (Days): 3 Period Pattern: (!) Irregular Menstrual Flow: Light Menstrual Control: Panty liner Menstrual Control Change Freq (Hours): 8 Dysmenorrhea: (!) Mild Dysmenorrhea Symptoms: Cramping Contraception/Family planning: vasectomy Sexually active: Yes  Health Maintenance Last Pap: 06/25/2021. Results were: Normal neg HPV, 5-year repeat Last mammogram: 07/26/2022. Results were: Normal Last colonoscopy: Never Last Dexa: Not indicated  Past medical history, past surgical history, family history and social history were all reviewed and documented in the EPIC chart. Married. 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 76 yo son, senior at [employer]. 45 yo daughter, Psychologist, sport and exercise. 52 yo son, owns business in [place].   ROS:  A ROS was performed and pertinent positives and negatives are included.  Exam:  Vitals:   08/01/22 1509  BP: 120/74  Pulse: 62  SpO2: 100%  Weight: 129 lb (58.5 kg)  Height: 5' 7.32" (1.71 m)   Body mass index is 20.01 kg/m.  General appearance:  Normal Thyroid:  Symmetrical, normal in size, without palpable masses or nodularity. Respiratory  Auscultation:  Clear without wheezing or rhonchi Cardiovascular  Auscultation:  Regular rate, without rubs, murmurs or gallops  Edema/varicosities:  Not grossly evident Abdominal  Soft,nontender, without masses, guarding or rebound.  Liver/spleen:  No organomegaly noted  Hernia:  None appreciated  Skin  Inspection:  Grossly normal Breasts: Examined lying and  sitting. Bilateral implants noted.   Right: Without masses, retractions, nipple discharge or axillary adenopathy.   Left: Without masses, retractions, nipple discharge or axillary adenopathy. Genitourinary   Inguinal/mons:  Normal without inguinal adenopathy  External genitalia:  Normal appearing vulva with no masses, tenderness, or lesions  BUS/Urethra/Skene's glands:  Normal  Vagina:  Normal appearing with normal color and discharge, no lesions  Cervix:  Normal appearing without discharge or lesions  Uterus:  Normal in size, shape and contour.  Midline and mobile, nontender  Adnexa/parametria:     Rt: Normal in size, without masses or tenderness.   Lt: Normal in size, without masses or tenderness.  Anus and perineum: Normal  Digital rectal exam: Deferred  Patient informed chaperone available to be present for breast and pelvic exam. Patient has requested no chaperone to be present. Patient has been advised what will be completed during breast and pelvic exam.   Assessment/Plan:  52 y.o. [id number] for annual exam.   Well female exam with routine gynecological exam - Education provided on SBEs, importance of preventative screenings, current guidelines, high calcium diet, regular exercise, and multivitamin daily. Labs with PCP and [facility].   HSV-1 infection - Plan: valACYclovir (VALTREX) 1000 MG tablet BID x 3-5 days at first sign of outbreak.  Anxiety - Plan: sertraline (ZOLOFT) 25 MG tablet daily. Doing well on this.   PMDD (premenstrual dysphoric disorder) - Plan: sertraline (ZOLOFT) 25 MG tablet daily. Great management.   Perimenopause - cycles have become slightly irregular. Prometrium and testosterone cream prescribed by [facility].   Screening for colon cancer - Discussed current guidelines and importance of preventative screenings. Plans to do colonoscopy.   Screening for cervical cancer - Normal Pap history. Will repeat at 5-year interval per guidelines.  Screening for  breast cancer - Normal mammogram history.  Continue annual screenings.  Normal breast exam today.  Return in 1 year for annual.     Olivia Mackie DNP, 3:39 PM 08/01/2022

## 2022-08-09 DIAGNOSIS — R92322 Mammographic fibroglandular density, left breast: Secondary | ICD-10-CM | POA: Diagnosis not present

## 2022-08-12 ENCOUNTER — Encounter: Payer: Self-pay | Admitting: Nurse Practitioner

## 2022-08-13 NOTE — Progress Notes (Signed)
  [name] [address] [place] [address] Phone: [phone] Subjective:   [name], am serving as a scribe for Dr. Antoine Primas.  I'm seeing this patient by the request  of:  Sun, Vyvyan, MD  CC: Back and neck pain  [id number]  Sharon Mercado is a 52 y.o. female coming in with complaint of back and neck pain. OMT on 07/01/2022. Patient states that she is the same as last visit. Feels manipulation helps manage her pain.   Medications patient has been prescribed:   Taking:         Reviewed prior external information including notes and imaging from previsou exam, outside providers and external EMR if available.   As well as notes that were available from care everywhere and other healthcare systems.  Past medical history, social, surgical and family history all reviewed in electronic medical record.  No pertanent information unless stated regarding to the chief complaint.   Past Medical History:  Diagnosis Date   Allergic rhinitis    Anxiety    GERD (gastroesophageal reflux disease)    Insomnia    Migraines    with aura   PMS (premenstrual syndrome)     No Known Allergies   Review of Systems:  No headache, visual changes, nausea, vomiting, diarrhea, constipation, dizziness, abdominal pain, skin rash, fevers, chills, night sweats, weight loss, swollen lymph nodes, body aches, joint swelling, chest pain, shortness of breath, mood changes. POSITIVE muscle aches  Objective  Blood pressure 102/62, pulse 77, height 5\' 7"  (1.702 m), weight 133 lb (60.3 kg), last menstrual period 07/16/2022, SpO2 99 %.   General: No apparent distress alert and oriented x3 mood and affect normal, dressed appropriately.  HEENT: Pupils equal, extraocular movements intact  Respiratory: Patient's speak in full sentences and does not appear short of breath  Cardiovascular: No lower extremity edema, non tender, no erythema  Back exam does have  some loss of lordosis.  There are some tightness still noted around the right sacroiliac joint.  Mild tightness with FABER on the right compared to left  Osteopathic findings  C2 flexed rotated and side bent right C6 flexed rotated and side bent left T3 extended rotated and side bent right inhaled rib T9 extended rotated and side bent left L1 flexed rotated and side bent right Sacrum right on right       Assessment and Plan:  No problem-specific Assessment & Plan notes found for this encounter.    Nonallopathic problems  Decision today to treat with OMT was based on Physical Exam  After verbal consent patient was treated with HVLA, ME, FPR techniques in cervical, rib, thoracic, lumbar, and sacral  areas  Patient tolerated the procedure well with improvement in symptoms  Patient given exercises, stretches and lifestyle modifications  See medications in patient instructions if given  Patient will follow up in 4-8 weeks             Note: This dictation was prepared with Dragon dictation along with smaller phrase technology. Any transcriptional errors that result from this process are unintentional.

## 2022-08-14 ENCOUNTER — Ambulatory Visit (INDEPENDENT_AMBULATORY_CARE_PROVIDER_SITE_OTHER): Payer: BC Managed Care – PPO | Admitting: Family Medicine

## 2022-08-14 ENCOUNTER — Encounter: Payer: Self-pay | Admitting: Family Medicine

## 2022-08-14 VITALS — BP 102/62 | HR 77 | Ht 67.0 in | Wt 133.0 lb

## 2022-08-14 DIAGNOSIS — M5136 Other intervertebral disc degeneration, lumbar region: Secondary | ICD-10-CM | POA: Diagnosis not present

## 2022-08-14 DIAGNOSIS — M9908 Segmental and somatic dysfunction of rib cage: Secondary | ICD-10-CM

## 2022-08-14 DIAGNOSIS — M9902 Segmental and somatic dysfunction of thoracic region: Secondary | ICD-10-CM

## 2022-08-14 DIAGNOSIS — M9904 Segmental and somatic dysfunction of sacral region: Secondary | ICD-10-CM | POA: Diagnosis not present

## 2022-08-14 DIAGNOSIS — M9903 Segmental and somatic dysfunction of lumbar region: Secondary | ICD-10-CM

## 2022-08-14 DIAGNOSIS — M9901 Segmental and somatic dysfunction of cervical region: Secondary | ICD-10-CM

## 2022-08-14 NOTE — Assessment & Plan Note (Signed)
Patient has been doing remarkably well.  Is responding extremely well to osteopathic manipulation.  Discussed core strengthening as well.  Discussed continuing to stay active.  Has been traveling recently.  Will follow-up again in 6 to 8 weeks

## 2022-09-24 NOTE — Progress Notes (Unsigned)
  [name] [address] [place] [address] Phone: [phone] Subjective:   [name], am serving as a scribe for Dr. Antoine Primas.  I'm seeing this patient by the request  of:  Sun, Vyvyan, MD  CC: Neck and back pain follow-up  [id number]  Sharon Mercado is a 52 y.o. female coming in with complaint of back and neck pain. OMT on 08/14/2022. Patient states   Medications patient has been prescribed:   Taking:         Reviewed prior external information including notes and imaging from previsou exam, outside providers and external EMR if available.   As well as notes that were available from care everywhere and other healthcare systems.  Past medical history, social, surgical and family history all reviewed in electronic medical record.  No pertanent information unless stated regarding to the chief complaint.   Past Medical History:  Diagnosis Date   Allergic rhinitis    Anxiety    GERD (gastroesophageal reflux disease)    Insomnia    Migraines    with aura   PMS (premenstrual syndrome)     No Known Allergies   Review of Systems:  No headache, visual changes, nausea, vomiting, diarrhea, constipation, dizziness, abdominal pain, skin rash, fevers, chills, night sweats, weight loss, swollen lymph nodes, body aches, joint swelling, chest pain, shortness of breath, mood changes. POSITIVE muscle aches  Objective  Blood pressure 100/74, pulse 93, height 5\' 7"  (1.702 m), weight 131 lb (59.4 kg), SpO2 99 %.   General: No apparent distress alert and oriented x3 mood and affect normal, dressed appropriately.  HEENT: Pupils equal, extraocular movements intact  Respiratory: Patient's speak in full sentences and does not appear short of breath  Cardiovascular: No lower extremity edema, non tender, no erythema  Low back exam does have some loss of lordosis.  Does have tenderness to palpation over the sacroiliac joint positive  Pearlean Brownie.  Osteopathic findings  C6 flexed rotated and side bent left T3 extended rotated and side bent right inhaled rib L2 flexed rotated and side bent right Sacrum right on right       Assessment and Plan:  Degenerative disc disease, lumbar Still has tightness of the secondary to having to drive a significant distance.  Discussed icing regimen and home exercises, discussed which activities to do and which ones to avoid.  Increase activity slowly otherwise.  Follow-up again in 6 to 8 weeks.    Nonallopathic problems  Decision today to treat with OMT was based on Physical Exam  After verbal consent patient was treated with HVLA, ME, FPR techniques in cervical, rib, thoracic, lumbar, and sacral  areas  Patient tolerated the procedure well with improvement in symptoms  Patient given exercises, stretches and lifestyle modifications  See medications in patient instructions if given  Patient will follow up in 4-8 weeks     The above documentation has been reviewed and is accurate and complete Sharon Saa, DO         Note: This dictation was prepared with Dragon dictation along with smaller phrase technology. Any transcriptional errors that result from this process are unintentional.

## 2022-09-25 ENCOUNTER — Ambulatory Visit (INDEPENDENT_AMBULATORY_CARE_PROVIDER_SITE_OTHER): Payer: BC Managed Care – PPO | Admitting: Family Medicine

## 2022-09-25 VITALS — BP 100/74 | HR 93 | Ht 67.0 in | Wt 131.0 lb

## 2022-09-25 DIAGNOSIS — M9904 Segmental and somatic dysfunction of sacral region: Secondary | ICD-10-CM | POA: Diagnosis not present

## 2022-09-25 DIAGNOSIS — M9902 Segmental and somatic dysfunction of thoracic region: Secondary | ICD-10-CM

## 2022-09-25 DIAGNOSIS — M9908 Segmental and somatic dysfunction of rib cage: Secondary | ICD-10-CM

## 2022-09-25 DIAGNOSIS — M9901 Segmental and somatic dysfunction of cervical region: Secondary | ICD-10-CM | POA: Diagnosis not present

## 2022-09-25 DIAGNOSIS — M9903 Segmental and somatic dysfunction of lumbar region: Secondary | ICD-10-CM | POA: Diagnosis not present

## 2022-09-25 DIAGNOSIS — M5136 Other intervertebral disc degeneration, lumbar region: Secondary | ICD-10-CM | POA: Diagnosis not present

## 2022-09-25 NOTE — Assessment & Plan Note (Signed)
Still has tightness of the secondary to having to drive a significant distance.  Discussed icing regimen and home exercises, discussed which activities to do and which ones to avoid.  Increase activity slowly otherwise.  Follow-up again in 6 to 8 weeks.

## 2022-09-25 NOTE — Patient Instructions (Signed)
See me again in 2 months °

## 2022-11-01 NOTE — Progress Notes (Signed)
  [name] [address] [place] [address] Phone: [phone] Subjective:   Sharon Mercado, am serving as a scribe for Dr. Antoine Primas.  I'm seeing this patient by the request  of:  Sun, Vyvyan, MD  CC: Low back pain follow-up  [id number]  Sharon Mercado is a 52 y.o. female coming in with complaint of back and neck pain. OMT on 09/25/2022. Patient states same per usual. No new concerns.  Medications patient has been prescribed:   Taking:         Reviewed prior external information including notes and imaging from previsou exam, outside providers and external EMR if available.   As well as notes that were available from care everywhere and other healthcare systems.  Past medical history, social, surgical and family history all reviewed in electronic medical record.  No pertanent information unless stated regarding to the chief complaint.   Past Medical History:  Diagnosis Date   Allergic rhinitis    Anxiety    GERD (gastroesophageal reflux disease)    Insomnia    Migraines    with aura   PMS (premenstrual syndrome)     No Known Allergies   Review of Systems:  No headache, visual changes, nausea, vomiting, diarrhea, constipation, dizziness, abdominal pain, skin rash, fevers, chills, night sweats, weight loss, swollen lymph nodes, body aches, joint swelling, chest pain, shortness of breath, mood changes. POSITIVE muscle aches  Objective  Blood pressure 102/64, pulse 74, height 5\' 7"  (1.702 m), weight 130 lb (59 kg), SpO2 98%.   General: No apparent distress alert and oriented x3 mood and affect normal, dressed appropriately.  HEENT: Pupils equal, extraocular movements intact  Respiratory: Patient's speak in full sentences and does not appear short of breath  Cardiovascular: No lower extremity edema, non tender, no erythema  Gait MSK:  Back   Osteopathic findings  C2 flexed rotated and side bent right C6 flexed rotated  and side bent left T3 extended rotated and side bent right inhaled rib T9 extended rotated and side bent left L2 flexed rotated and side bent right Sacrum right on right       Assessment and Plan:  Degenerative disc disease, lumbar Degenerative disc disease noted.  Discussed icing regimen and home exercises, discussed which activities to do and which ones to avoid.  Discussed posture and ergonomics.  Increase activity slowly.  Follow-up with me again in 6 to 8 weeks.    Nonallopathic problems  Decision today to treat with OMT was based on Physical Exam  After verbal consent patient was treated with HVLA, ME, FPR techniques in cervical, rib, thoracic, lumbar, and sacral  areas  Patient tolerated the procedure well with improvement in symptoms  Patient given exercises, stretches and lifestyle modifications  See medications in patient instructions if given  Patient will follow up in 4-8 weeks    The above documentation has been reviewed and is accurate and complete Judi Saa, DO        Note: This dictation was prepared with Dragon dictation along with smaller phrase technology. Any transcriptional errors that result from this process are unintentional.

## 2022-11-07 ENCOUNTER — Ambulatory Visit (INDEPENDENT_AMBULATORY_CARE_PROVIDER_SITE_OTHER): Payer: BC Managed Care – PPO | Admitting: Family Medicine

## 2022-11-07 ENCOUNTER — Encounter: Payer: Self-pay | Admitting: Family Medicine

## 2022-11-07 VITALS — BP 102/64 | HR 74 | Ht 67.0 in | Wt 130.0 lb

## 2022-11-07 DIAGNOSIS — M9902 Segmental and somatic dysfunction of thoracic region: Secondary | ICD-10-CM

## 2022-11-07 DIAGNOSIS — M51369 Other intervertebral disc degeneration, lumbar region without mention of lumbar back pain or lower extremity pain: Secondary | ICD-10-CM

## 2022-11-07 DIAGNOSIS — M9908 Segmental and somatic dysfunction of rib cage: Secondary | ICD-10-CM | POA: Diagnosis not present

## 2022-11-07 DIAGNOSIS — M9904 Segmental and somatic dysfunction of sacral region: Secondary | ICD-10-CM

## 2022-11-07 DIAGNOSIS — M5136 Other intervertebral disc degeneration, lumbar region: Secondary | ICD-10-CM | POA: Diagnosis not present

## 2022-11-07 DIAGNOSIS — M9903 Segmental and somatic dysfunction of lumbar region: Secondary | ICD-10-CM

## 2022-11-07 DIAGNOSIS — M9901 Segmental and somatic dysfunction of cervical region: Secondary | ICD-10-CM

## 2022-11-07 NOTE — Assessment & Plan Note (Signed)
Degenerative disc disease noted.  Discussed icing regimen and home exercises, discussed which activities to do and which ones to avoid.  Discussed posture and ergonomics.  Increase activity slowly.  Follow-up with me again in 6 to 8 weeks.

## 2022-12-04 DIAGNOSIS — R7989 Other specified abnormal findings of blood chemistry: Secondary | ICD-10-CM | POA: Diagnosis not present

## 2022-12-04 DIAGNOSIS — E039 Hypothyroidism, unspecified: Secondary | ICD-10-CM | POA: Diagnosis not present

## 2022-12-04 DIAGNOSIS — N951 Menopausal and female climacteric states: Secondary | ICD-10-CM | POA: Diagnosis not present

## 2022-12-04 DIAGNOSIS — E559 Vitamin D deficiency, unspecified: Secondary | ICD-10-CM | POA: Diagnosis not present

## 2022-12-13 NOTE — Progress Notes (Unsigned)
  [name] [address] [place] [address] Phone: [phone] Subjective:   Sharon Mercado, am serving as a scribe for Dr. Antoine Primas.  I'm seeing this patient by the request  of:  Sun, Vyvyan, MD  CC: Low back and neck pain follow-up  [id number]  Sharon Mercado is a 52 y.o. female coming in with complaint of back and neck pain. OMT on 11/07/2022. Patient states doing well. Here for manipulation. No new concerns.  Medications patient has been prescribed:   Taking:         Reviewed prior external information including notes and imaging from previsou exam, outside providers and external EMR if available.   As well as notes that were available from care everywhere and other healthcare systems.  Past medical history, social, surgical and family history all reviewed in electronic medical record.  No pertanent information unless stated regarding to the chief complaint.   Past Medical History:  Diagnosis Date   Allergic rhinitis    Anxiety    GERD (gastroesophageal reflux disease)    Insomnia    Migraines    with aura   PMS (premenstrual syndrome)     No Known Allergies   Review of Systems:  No headache, visual changes, nausea, vomiting, diarrhea, constipation, dizziness, abdominal pain, skin rash, fevers, chills, night sweats, weight loss, swollen lymph nodes, body aches, joint swelling, chest pain, shortness of breath, mood changes. POSITIVE muscle aches  Objective  Pulse 77, height 5\' 7"  (1.702 m), weight 130 lb (59 kg), SpO2 98%.   General: No apparent distress alert and oriented x3 mood and affect normal, dressed appropriately.  HEENT: Pupils equal, extraocular movements intact  Respiratory: Patient's speak in full sentences and does not appear short of breath  Cardiovascular: No lower extremity edema, non tender, no erythema  MSK:  Back low back does have some loss lordosis noted.  Some tightness noted in the paraspinal  musculature.  Neck exam also has more tightness noted at the left occipital area than usual.  Osteopathic findings  C2 flexed rotated and side bent left C5 flexed rotated and side bent right T3 extended rotated and side bent right inhaled rib T9 extended rotated and side bent left L2 flexed rotated and side bent right Sacrum right on right       Assessment and Plan:  Somatic dysfunction of right sacroiliac joint Responding extremely well to the osteopathic manipulation.  Did have some more discomfort noted in the paraspinal musculature of the neck today.  We will continue to monitor that as well.  Known arthritic changes.  Will follow-up again in 6 to 8 weeks otherwise.    Nonallopathic problems  Decision today to treat with OMT was based on Physical Exam  After verbal consent patient was treated with HVLA, ME, FPR techniques in cervical, rib, thoracic, lumbar, and sacral  areas  Patient tolerated the procedure well with improvement in symptoms  Patient given exercises, stretches and lifestyle modifications  See medications in patient instructions if given  Patient will follow up in 4-8 weeks    The above documentation has been reviewed and is accurate and complete Judi Saa, DO          Note: This dictation was prepared with Dragon dictation along with smaller phrase technology. Any transcriptional errors that result from this process are unintentional.

## 2022-12-17 ENCOUNTER — Encounter: Payer: Self-pay | Admitting: Family Medicine

## 2022-12-17 ENCOUNTER — Ambulatory Visit (INDEPENDENT_AMBULATORY_CARE_PROVIDER_SITE_OTHER): Payer: BC Managed Care – PPO | Admitting: Family Medicine

## 2022-12-17 VITALS — HR 77 | Ht 67.0 in | Wt 130.0 lb

## 2022-12-17 DIAGNOSIS — M9901 Segmental and somatic dysfunction of cervical region: Secondary | ICD-10-CM | POA: Diagnosis not present

## 2022-12-17 DIAGNOSIS — M9904 Segmental and somatic dysfunction of sacral region: Secondary | ICD-10-CM | POA: Diagnosis not present

## 2022-12-17 DIAGNOSIS — M9908 Segmental and somatic dysfunction of rib cage: Secondary | ICD-10-CM

## 2022-12-17 DIAGNOSIS — M9902 Segmental and somatic dysfunction of thoracic region: Secondary | ICD-10-CM

## 2022-12-17 DIAGNOSIS — M9903 Segmental and somatic dysfunction of lumbar region: Secondary | ICD-10-CM

## 2022-12-17 NOTE — Assessment & Plan Note (Signed)
Responding extremely well to the osteopathic manipulation.  Did have some more discomfort noted in the paraspinal musculature of the neck today.  We will continue to monitor that as well.  Known arthritic changes.  Will follow-up again in 6 to 8 weeks otherwise.

## 2022-12-17 NOTE — Patient Instructions (Signed)
Good to see you! Enjoy the weekend See you again in 6 weeks

## 2023-01-02 DIAGNOSIS — E349 Endocrine disorder, unspecified: Secondary | ICD-10-CM | POA: Diagnosis not present

## 2023-01-02 DIAGNOSIS — R7989 Other specified abnormal findings of blood chemistry: Secondary | ICD-10-CM | POA: Diagnosis not present

## 2023-01-02 DIAGNOSIS — E039 Hypothyroidism, unspecified: Secondary | ICD-10-CM | POA: Diagnosis not present

## 2023-01-02 DIAGNOSIS — E559 Vitamin D deficiency, unspecified: Secondary | ICD-10-CM | POA: Diagnosis not present

## 2023-01-07 DIAGNOSIS — M542 Cervicalgia: Secondary | ICD-10-CM | POA: Diagnosis not present

## 2023-01-07 DIAGNOSIS — M545 Low back pain, unspecified: Secondary | ICD-10-CM | POA: Diagnosis not present

## 2023-01-23 DIAGNOSIS — M545 Low back pain, unspecified: Secondary | ICD-10-CM | POA: Diagnosis not present

## 2023-01-23 DIAGNOSIS — M542 Cervicalgia: Secondary | ICD-10-CM | POA: Diagnosis not present

## 2023-01-27 NOTE — Progress Notes (Unsigned)
  [name] [address] [place] [address] Phone: [phone] Subjective:   Sharon Mercado, am serving as a scribe for Dr. Antoine Primas.  I'm seeing this patient by the request  of:  Sun, Vyvyan, MD  CC: Back and neck pain follow-up  [id number]  Sharon Mercado is a 52 y.o. female coming in with complaint of back and neck pain. OMT on 12/17/2022. Patient states overall doing well.  Very mild discomfort and pain when doing anything else.  Still very aware of it.  Patient states that she does not have a day when she is not thinking about it but still able to do all activities.  Medications patient has been prescribed:   Taking:         Reviewed prior external information including notes and imaging from previsou exam, outside providers and external EMR if available.   As well as notes that were available from care everywhere and other healthcare systems.  Past medical history, social, surgical and family history all reviewed in electronic medical record.  No pertanent information unless stated regarding to the chief complaint.   Past Medical History:  Diagnosis Date   Allergic rhinitis    Anxiety    GERD (gastroesophageal reflux disease)    Insomnia    Migraines    with aura   PMS (premenstrual syndrome)     No Known Allergies   Review of Systems:  No headache, visual changes, nausea, vomiting, diarrhea, constipation, dizziness, abdominal pain, skin rash, fevers, chills, night sweats, weight loss, swollen lymph nodes, body aches, joint swelling, chest pain, shortness of breath, mood changes. POSITIVE muscle aches  Objective  Blood pressure 110/62, pulse (!) 59, height 5\' 7"  (1.702 m), weight 130 lb (59 kg).   General: No apparent distress alert and oriented x3 mood and affect normal, dressed appropriately.  HEENT: Pupils equal, extraocular movements intact  Respiratory: Patient's speak in full sentences and does not appear  short of breath  Cardiovascular: No lower extremity edema, non tender, no erythema  Low back does have some loss of lordosis noted.  Some tenderness to palpation in the paraspinal musculature.  Osteopathic findings  C3 flexed rotated and side bent right C7 flexed rotated and side bent left T3 extended rotated and side bent right inhaled rib T9 extended rotated and side bent left L2 flexed rotated and side bent right Sacrum right on right       Assessment and Plan:  Degenerative disc disease, lumbar Stable overall.  Responding well to osteopathic manipulation.  Will continue to be active otherwise.  Nothing that should stop her from activities at this moment.  Follow-up again in 6 to 8 weeks    Nonallopathic problems  Decision today to treat with OMT was based on Physical Exam  After verbal consent patient was treated with HVLA, ME, FPR techniques in cervical, rib, thoracic, lumbar, and sacral  areas  Patient tolerated the procedure well with improvement in symptoms  Patient given exercises, stretches and lifestyle modifications  See medications in patient instructions if given  Patient will follow up in 4-8 weeks    The above documentation has been reviewed and is accurate and complete Judi Saa, DO          Note: This dictation was prepared with Dragon dictation along with smaller phrase technology. Any transcriptional errors that result from this process are unintentional.

## 2023-01-28 ENCOUNTER — Ambulatory Visit (INDEPENDENT_AMBULATORY_CARE_PROVIDER_SITE_OTHER): Payer: BC Managed Care – PPO | Admitting: Family Medicine

## 2023-01-28 ENCOUNTER — Encounter: Payer: Self-pay | Admitting: Family Medicine

## 2023-01-28 VITALS — BP 110/62 | HR 59 | Ht 67.0 in | Wt 130.0 lb

## 2023-01-28 DIAGNOSIS — M9904 Segmental and somatic dysfunction of sacral region: Secondary | ICD-10-CM

## 2023-01-28 DIAGNOSIS — M9901 Segmental and somatic dysfunction of cervical region: Secondary | ICD-10-CM

## 2023-01-28 DIAGNOSIS — M5136 Other intervertebral disc degeneration, lumbar region with discogenic back pain only: Secondary | ICD-10-CM | POA: Diagnosis not present

## 2023-01-28 DIAGNOSIS — M9908 Segmental and somatic dysfunction of rib cage: Secondary | ICD-10-CM | POA: Diagnosis not present

## 2023-01-28 DIAGNOSIS — M9903 Segmental and somatic dysfunction of lumbar region: Secondary | ICD-10-CM | POA: Diagnosis not present

## 2023-01-28 DIAGNOSIS — M9902 Segmental and somatic dysfunction of thoracic region: Secondary | ICD-10-CM

## 2023-01-28 NOTE — Patient Instructions (Signed)
Enjoy [place] See you again in 6-8 weeks

## 2023-01-28 NOTE — Assessment & Plan Note (Signed)
Stable overall.  Responding well to osteopathic manipulation.  Will continue to be active otherwise.  Nothing that should stop her from activities at this moment.  Follow-up again in 6 to 8 weeks

## 2023-01-29 DIAGNOSIS — M545 Low back pain, unspecified: Secondary | ICD-10-CM | POA: Diagnosis not present

## 2023-01-29 DIAGNOSIS — M542 Cervicalgia: Secondary | ICD-10-CM | POA: Diagnosis not present

## 2023-02-05 DIAGNOSIS — M542 Cervicalgia: Secondary | ICD-10-CM | POA: Diagnosis not present

## 2023-02-05 DIAGNOSIS — M545 Low back pain, unspecified: Secondary | ICD-10-CM | POA: Diagnosis not present

## 2023-02-13 DIAGNOSIS — M542 Cervicalgia: Secondary | ICD-10-CM | POA: Diagnosis not present

## 2023-02-13 DIAGNOSIS — M545 Low back pain, unspecified: Secondary | ICD-10-CM | POA: Diagnosis not present

## 2023-02-28 DIAGNOSIS — K6389 Other specified diseases of intestine: Secondary | ICD-10-CM | POA: Diagnosis not present

## 2023-02-28 DIAGNOSIS — Z1211 Encounter for screening for malignant neoplasm of colon: Secondary | ICD-10-CM | POA: Diagnosis not present

## 2023-03-03 DIAGNOSIS — M545 Low back pain, unspecified: Secondary | ICD-10-CM | POA: Diagnosis not present

## 2023-03-03 DIAGNOSIS — M542 Cervicalgia: Secondary | ICD-10-CM | POA: Diagnosis not present

## 2023-03-12 ENCOUNTER — Ambulatory Visit: Payer: BC Managed Care – PPO | Admitting: Family Medicine

## 2023-03-12 DIAGNOSIS — L821 Other seborrheic keratosis: Secondary | ICD-10-CM | POA: Diagnosis not present

## 2023-03-12 DIAGNOSIS — L719 Rosacea, unspecified: Secondary | ICD-10-CM | POA: Diagnosis not present

## 2023-03-12 DIAGNOSIS — L578 Other skin changes due to chronic exposure to nonionizing radiation: Secondary | ICD-10-CM | POA: Diagnosis not present

## 2023-03-12 DIAGNOSIS — L0100 Impetigo, unspecified: Secondary | ICD-10-CM | POA: Diagnosis not present

## 2023-03-17 DIAGNOSIS — M542 Cervicalgia: Secondary | ICD-10-CM | POA: Diagnosis not present

## 2023-03-17 DIAGNOSIS — M545 Low back pain, unspecified: Secondary | ICD-10-CM | POA: Diagnosis not present

## 2023-03-24 DIAGNOSIS — M545 Low back pain, unspecified: Secondary | ICD-10-CM | POA: Diagnosis not present

## 2023-03-24 DIAGNOSIS — M542 Cervicalgia: Secondary | ICD-10-CM | POA: Diagnosis not present

## 2023-04-02 DIAGNOSIS — M545 Low back pain, unspecified: Secondary | ICD-10-CM | POA: Diagnosis not present

## 2023-04-02 DIAGNOSIS — M542 Cervicalgia: Secondary | ICD-10-CM | POA: Diagnosis not present

## 2023-04-30 ENCOUNTER — Other Ambulatory Visit: Payer: Self-pay | Admitting: Medical Genetics

## 2023-04-30 DIAGNOSIS — M542 Cervicalgia: Secondary | ICD-10-CM | POA: Diagnosis not present

## 2023-04-30 DIAGNOSIS — M545 Low back pain, unspecified: Secondary | ICD-10-CM | POA: Diagnosis not present

## 2023-05-01 NOTE — Progress Notes (Signed)
 [facility] [address] [place] [address] Phone: [phone] Subjective:    I'm seeing this patient by the request  of:  Sun, Vyvyan, MD  CC: back and neck pain follow up   [id number]  Sharon Mercado is a 53 y.o. female coming in with complaint of back and neck pain. OMT on 01/28/2023. Patient states that overall she has been doing relatively well.  Some discomfort here and there but nothing severe.  Continues to have some of the neck pain.  Medications patient has been prescribed:   Taking:         Reviewed prior external information including notes and imaging from previsou exam, outside providers and external EMR if available.   As well as notes that were available from care everywhere and other healthcare systems.  Past medical history, social, surgical and family history all reviewed in electronic medical record.  No pertanent information unless stated regarding to the chief complaint.   Past Medical History:  Diagnosis Date   Allergic rhinitis    Anxiety    GERD (gastroesophageal reflux disease)    Insomnia    Migraines    with aura   PMS (premenstrual syndrome)     No Known Allergies   Review of Systems:  No headache, visual changes, nausea, vomiting, diarrhea, constipation, dizziness, abdominal pain, skin rash, fevers, chills, night sweats, weight loss, swollen lymph nodes, body aches, joint swelling, chest pain, shortness of breath, mood changes. POSITIVE muscle aches  Objective  Blood pressure 102/60, pulse 77, height 5' 7 (1.702 m), SpO2 99%.   General: No apparent distress alert and oriented x3 mood and affect normal, dressed appropriately.  HEENT: Pupils equal, extraocular movements intact  Respiratory: Patient's speak in full sentences and does not appear short of breath  Cardiovascular: No lower extremity edema, non tender, no erythema  Gait MSK:  Back does have some loss of lordosis noted.  Neck exam  does have tightness noted.  Seems to be right greater than left.  Some tightness noted noted over the right sacroiliac joint  Osteopathic findings  C2 flexed rotated and side bent right C3 flexed rotated and side bent right C6 flexed rotated and side bent left T3 extended rotated and side bent right inhaled rib T9 extended rotated and side bent left L2 flexed rotated and side bent right Sacrum right on right       Assessment and Plan:  Degenerative disc disease, lumbar Tightness noted.  Has been doing relatively well though.  We discussed with patient that if any radicular symptoms further imaging may be necessary.  Patient has been able to be relatively active without any significant discomfort or pain.  We discussed the Zanaflex to 4 mg at night as needed.  Follow-up again in 6 to 8 weeks otherwise.    Nonallopathic problems  Decision today to treat with OMT was based on Physical Exam  After verbal consent patient was treated with HVLA, ME, FPR techniques in cervical, rib, thoracic, lumbar, and sacral  areas  Patient tolerated the procedure well with improvement in symptoms  Patient given exercises, stretches and lifestyle modifications  See medications in patient instructions if given  Patient will follow up in 4-8 weeks     The above documentation has been reviewed and is accurate and complete Marcanthony Sleight M Shekelia Boutin, DO         Note: This dictation was prepared with Dragon dictation along with smaller phrase technology. Any transcriptional errors  that result from this process are unintentional.

## 2023-05-05 ENCOUNTER — Ambulatory Visit: Payer: BC Managed Care – PPO | Admitting: Family Medicine

## 2023-05-05 ENCOUNTER — Encounter: Payer: Self-pay | Admitting: Family Medicine

## 2023-05-05 VITALS — BP 102/60 | HR 77 | Ht 67.0 in

## 2023-05-05 DIAGNOSIS — M9908 Segmental and somatic dysfunction of rib cage: Secondary | ICD-10-CM

## 2023-05-05 DIAGNOSIS — M5136 Other intervertebral disc degeneration, lumbar region with discogenic back pain only: Secondary | ICD-10-CM

## 2023-05-05 DIAGNOSIS — M9902 Segmental and somatic dysfunction of thoracic region: Secondary | ICD-10-CM

## 2023-05-05 DIAGNOSIS — M9904 Segmental and somatic dysfunction of sacral region: Secondary | ICD-10-CM | POA: Diagnosis not present

## 2023-05-05 DIAGNOSIS — M9901 Segmental and somatic dysfunction of cervical region: Secondary | ICD-10-CM

## 2023-05-05 DIAGNOSIS — M9903 Segmental and somatic dysfunction of lumbar region: Secondary | ICD-10-CM

## 2023-05-05 NOTE — Assessment & Plan Note (Signed)
 Tightness noted.  Has been doing relatively well though.  We discussed with patient that if any radicular symptoms further imaging may be necessary.  Patient has been able to be relatively active without any significant discomfort or pain.  We discussed the Zanaflex to 4 mg at night as needed.  Follow-up again in 6 to 8 weeks otherwise.

## 2023-05-05 NOTE — Patient Instructions (Signed)
 You are amazing  [employer] Thanks for the shopping list See me again in 2-3 months

## 2023-05-07 DIAGNOSIS — M545 Low back pain, unspecified: Secondary | ICD-10-CM | POA: Diagnosis not present

## 2023-05-07 DIAGNOSIS — M542 Cervicalgia: Secondary | ICD-10-CM | POA: Diagnosis not present

## 2023-05-14 DIAGNOSIS — M542 Cervicalgia: Secondary | ICD-10-CM | POA: Diagnosis not present

## 2023-05-14 DIAGNOSIS — M545 Low back pain, unspecified: Secondary | ICD-10-CM | POA: Diagnosis not present

## 2023-05-20 DIAGNOSIS — M545 Low back pain, unspecified: Secondary | ICD-10-CM | POA: Diagnosis not present

## 2023-05-20 DIAGNOSIS — M542 Cervicalgia: Secondary | ICD-10-CM | POA: Diagnosis not present

## 2023-05-22 ENCOUNTER — Other Ambulatory Visit (HOSPITAL_COMMUNITY)
Admission: RE | Admit: 2023-05-22 | Discharge: 2023-05-22 | Disposition: A | Discharge status: Home / Self Care | Payer: Self-pay | Source: Ambulatory Visit | Attending: Oncology | Admitting: Oncology

## 2023-06-02 LAB — GENECONNECT MOLECULAR SCREEN: Genetic Analysis Overall Interpretation: NEGATIVE

## 2023-06-03 DIAGNOSIS — M545 Low back pain, unspecified: Secondary | ICD-10-CM | POA: Diagnosis not present

## 2023-06-03 DIAGNOSIS — M542 Cervicalgia: Secondary | ICD-10-CM | POA: Diagnosis not present

## 2023-06-11 ENCOUNTER — Other Ambulatory Visit: Payer: Self-pay | Admitting: Nurse Practitioner

## 2023-06-11 DIAGNOSIS — B009 Herpesviral infection, unspecified: Secondary | ICD-10-CM

## 2023-06-11 NOTE — Telephone Encounter (Signed)
Med refill request: Valtrex Last AEX: 08/01/22 Next AEX: not scheduled Last MMG (if hormonal med) 08/09/22 Refill authorized: Please Advise, #[id number], 1 RF

## 2023-06-13 NOTE — Progress Notes (Signed)
 Sharon Mercado [address] [place] [address] Phone: [phone] Subjective:   [name], am serving as a scribe for Dr. Antoine Primas.  I'm seeing this patient by the request  of:  Sun, Vyvyan, MD  CC: back and neck pain follow up   [id number]  Sharon Mercado is a 53 y.o. female coming in with complaint of back and neck pain. OMT on 05/05/2023. Patient states continuing to have some discomfort as well.  With nothing that stopping her from activity.  Getting ready to travel to [place].  Medications patient has been prescribed:   Taking:         Reviewed prior external information including notes and imaging from previsou exam, outside providers and external EMR if available.   As well as notes that were available from care everywhere and other healthcare systems.  Past medical history, social, surgical and family history all reviewed in electronic medical record.  No pertanent information unless stated regarding to the chief complaint.   Past Medical History:  Diagnosis Date   Allergic rhinitis    Anxiety    GERD (gastroesophageal reflux disease)    Insomnia    Migraines    with aura   PMS (premenstrual syndrome)     No Known Allergies   Review of Systems:  No headache, visual changes, nausea, vomiting, diarrhea, constipation, dizziness, abdominal pain, skin rash, fevers, chills, night sweats, weight loss, swollen lymph nodes, body aches, joint swelling, chest pain, shortness of breath, mood changes. POSITIVE muscle aches  Objective  Blood pressure 98/68, pulse 74, height 5\' 7"  (1.702 m), weight 138 lb (62.6 kg), SpO2 98%.   General: No apparent distress alert and oriented x3 mood and affect normal, dressed appropriately.  HEENT: Pupils equal, extraocular movements intact  Respiratory: Patient's speak in full sentences and does not appear short of breath  Cardiovascular: No lower extremity edema, non tender, no erythema   Gait relatively normal MSK:  Back does have some loss lordosis noted.  Some tenderness to palpation in the paraspinal musculature.  Mild tightness with Pearlean Brownie right greater than left.  Osteopathic findings  C2 flexed rotated and side bent right C6 flexed rotated and side bent left T3 extended rotated and side bent right inhaled rib T9 extended rotated and side bent left L2 flexed rotated and side bent right Sacrum right on right       Assessment and Plan:  Degenerative disc disease, lumbar Underlying arthritic changes.  Doing very well though and working on core stability and strengthening.  We discussed icing regimen of home exercises, which activities to do and which ones to avoid.  Increase activity slowly.  Discussed icing regimen.  Discussed core strengthening.  Will follow-up again in 6 to 8 weeks.    Nonallopathic problems  Decision today to treat with OMT was based on Physical Exam  After verbal consent patient was treated with HVLA, ME, FPR techniques in cervical, rib, thoracic, lumbar, and sacral  areas  Patient tolerated the procedure well with improvement in symptoms  Patient given exercises, stretches and lifestyle modifications  See medications in patient instructions if given  Patient will follow up in 4-8 weeks    The above documentation has been reviewed and is accurate and complete Judi Saa, DO          Note: This dictation was prepared with Dragon dictation along with smaller phrase technology. Any transcriptional errors that result from this process are unintentional.

## 2023-06-16 ENCOUNTER — Telehealth: Payer: Self-pay

## 2023-06-16 ENCOUNTER — Ambulatory Visit (INDEPENDENT_AMBULATORY_CARE_PROVIDER_SITE_OTHER): Payer: BC Managed Care – PPO | Admitting: Family Medicine

## 2023-06-16 ENCOUNTER — Encounter: Payer: Self-pay | Admitting: Family Medicine

## 2023-06-16 VITALS — BP 98/68 | HR 74 | Ht 67.0 in | Wt 138.0 lb

## 2023-06-16 DIAGNOSIS — M9903 Segmental and somatic dysfunction of lumbar region: Secondary | ICD-10-CM

## 2023-06-16 DIAGNOSIS — M9904 Segmental and somatic dysfunction of sacral region: Secondary | ICD-10-CM | POA: Diagnosis not present

## 2023-06-16 DIAGNOSIS — M9901 Segmental and somatic dysfunction of cervical region: Secondary | ICD-10-CM

## 2023-06-16 DIAGNOSIS — M5136 Other intervertebral disc degeneration, lumbar region with discogenic back pain only: Secondary | ICD-10-CM | POA: Diagnosis not present

## 2023-06-16 DIAGNOSIS — M9902 Segmental and somatic dysfunction of thoracic region: Secondary | ICD-10-CM

## 2023-06-16 DIAGNOSIS — M9908 Segmental and somatic dysfunction of rib cage: Secondary | ICD-10-CM

## 2023-06-16 NOTE — Telephone Encounter (Signed)
 Correspondence from the pt's insurance stating that it is getting close to the expiration date on pt's medication (eszopiclone/Lunesta).   Per EMR investigation, I do not believe that she is taking this anymore.   Ok to defer for now?  Please advise.

## 2023-06-16 NOTE — Telephone Encounter (Signed)
 Yes. She reported no longer taking. Thanks.

## 2023-06-16 NOTE — Patient Instructions (Signed)
 Good to see you Enjoy [place] See me again in 6-8 weeks

## 2023-06-16 NOTE — Assessment & Plan Note (Signed)
 Underlying arthritic changes.  Doing very well though and working on core stability and strengthening.  We discussed icing regimen of home exercises, which activities to do and which ones to avoid.  Increase activity slowly.  Discussed icing regimen.  Discussed core strengthening.  Will follow-up again in 6 to 8 weeks.

## 2023-06-19 DIAGNOSIS — M545 Low back pain, unspecified: Secondary | ICD-10-CM | POA: Diagnosis not present

## 2023-06-19 DIAGNOSIS — M542 Cervicalgia: Secondary | ICD-10-CM | POA: Diagnosis not present

## 2023-06-26 NOTE — Telephone Encounter (Signed)
 Another correspndence from pt's insurance received today re: this same topic. Other provided to MR to scan into EMR. Will discard this one.

## 2023-07-11 DIAGNOSIS — B009 Herpesviral infection, unspecified: Secondary | ICD-10-CM | POA: Diagnosis not present

## 2023-07-11 DIAGNOSIS — L0100 Impetigo, unspecified: Secondary | ICD-10-CM | POA: Diagnosis not present

## 2023-07-21 NOTE — Progress Notes (Deleted)
  [name] [address] [place] [address] Phone: [phone] Subjective:    I'm seeing this patient by the request  of:  [name], MD  CC:   [id number]  Sharon Mercado is a 53 y.o. female coming in with complaint of back and neck pain. OMT on 06/16/2023. Patient states   Medications patient has been prescribed:   Taking:         Reviewed prior external information including notes and imaging from previsou exam, outside providers and external EMR if available.   As well as notes that were available from care everywhere and other healthcare systems.  Past medical history, social, surgical and family history all reviewed in electronic medical record.  No pertanent information unless stated regarding to the chief complaint.   Past Medical History:  Diagnosis Date   Allergic rhinitis    Anxiety    GERD (gastroesophageal reflux disease)    Insomnia    Migraines    with aura   PMS (premenstrual syndrome)     No Known Allergies   Review of Systems:  No headache, visual changes, nausea, vomiting, diarrhea, constipation, dizziness, abdominal pain, skin rash, fevers, chills, night sweats, weight loss, swollen lymph nodes, body aches, joint swelling, chest pain, shortness of breath, mood changes. POSITIVE muscle aches  Objective  There were no vitals taken for this visit.   General: No apparent distress alert and oriented x3 mood and affect normal, dressed appropriately.  HEENT: Pupils equal, extraocular movements intact  Respiratory: Patient's speak in full sentences and does not appear short of breath  Cardiovascular: No lower extremity edema, non tender, no erythema  Gait MSK:  Back   Osteopathic findings  C2 flexed rotated and side bent right C6 flexed rotated and side bent left T3 extended rotated and side bent right inhaled rib T9 extended rotated and side bent left L2 flexed rotated and side bent right Sacrum  right on right       Assessment and Plan:  No problem-specific Assessment & Plan notes found for this encounter.    Nonallopathic problems  Decision today to treat with OMT was based on Physical Exam  After verbal consent patient was treated with HVLA, ME, FPR techniques in cervical, rib, thoracic, lumbar, and sacral  areas  Patient tolerated the procedure well with improvement in symptoms  Patient given exercises, stretches and lifestyle modifications  See medications in patient instructions if given  Patient will follow up in 4-8 weeks             Note: This dictation was prepared with Dragon dictation along with smaller phrase technology. Any transcriptional errors that result from this process are unintentional.

## 2023-07-28 ENCOUNTER — Ambulatory Visit: Payer: BC Managed Care – PPO | Admitting: Family Medicine

## 2023-07-28 DIAGNOSIS — E349 Endocrine disorder, unspecified: Secondary | ICD-10-CM | POA: Diagnosis not present

## 2023-07-28 DIAGNOSIS — N951 Menopausal and female climacteric states: Secondary | ICD-10-CM | POA: Diagnosis not present

## 2023-07-28 DIAGNOSIS — E559 Vitamin D deficiency, unspecified: Secondary | ICD-10-CM | POA: Diagnosis not present

## 2023-07-28 DIAGNOSIS — R7989 Other specified abnormal findings of blood chemistry: Secondary | ICD-10-CM | POA: Diagnosis not present

## 2023-07-28 DIAGNOSIS — E039 Hypothyroidism, unspecified: Secondary | ICD-10-CM | POA: Diagnosis not present

## 2023-07-31 DIAGNOSIS — M545 Low back pain, unspecified: Secondary | ICD-10-CM | POA: Diagnosis not present

## 2023-07-31 DIAGNOSIS — M542 Cervicalgia: Secondary | ICD-10-CM | POA: Diagnosis not present

## 2023-08-08 DIAGNOSIS — Z1231 Encounter for screening mammogram for malignant neoplasm of breast: Secondary | ICD-10-CM | POA: Diagnosis not present

## 2023-08-11 ENCOUNTER — Encounter: Payer: Self-pay | Admitting: Obstetrics and Gynecology

## 2023-08-11 ENCOUNTER — Encounter: Payer: Self-pay | Admitting: Nurse Practitioner

## 2023-09-08 ENCOUNTER — Other Ambulatory Visit: Payer: Self-pay | Admitting: Nurse Practitioner

## 2023-09-08 DIAGNOSIS — B009 Herpesviral infection, unspecified: Secondary | ICD-10-CM

## 2023-09-08 NOTE — Telephone Encounter (Signed)
 Medication refill request: valtrex  1000mg  Last AEX:  08-01-22 Next AEX: not scheduled. Message sent to scheduling department to contact her Last MMG (if hormonal medication request): none Refill authorized: please approve or deny as appropriate

## 2023-10-09 IMAGING — DX DG LUMBAR SPINE 2-3V
3 series · 3 of 3 positions shown · non-contrast
Comparison: None.

CLINICAL DATA: Chronic low back pain.

EXAM:
LUMBAR SPINE - 2-3 VIEW

[l-spine ap]
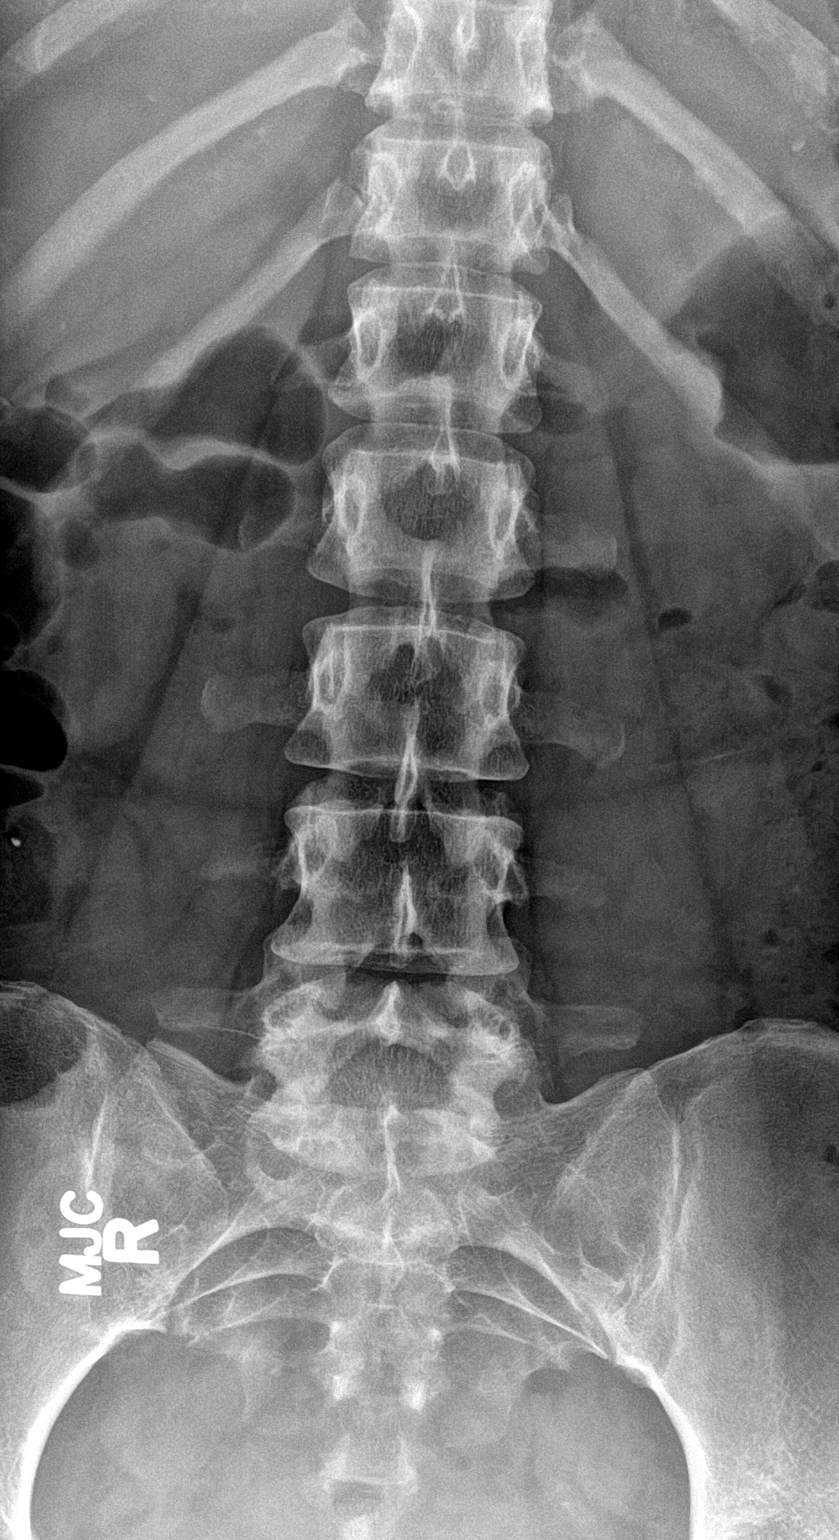

[l-spine lateral]
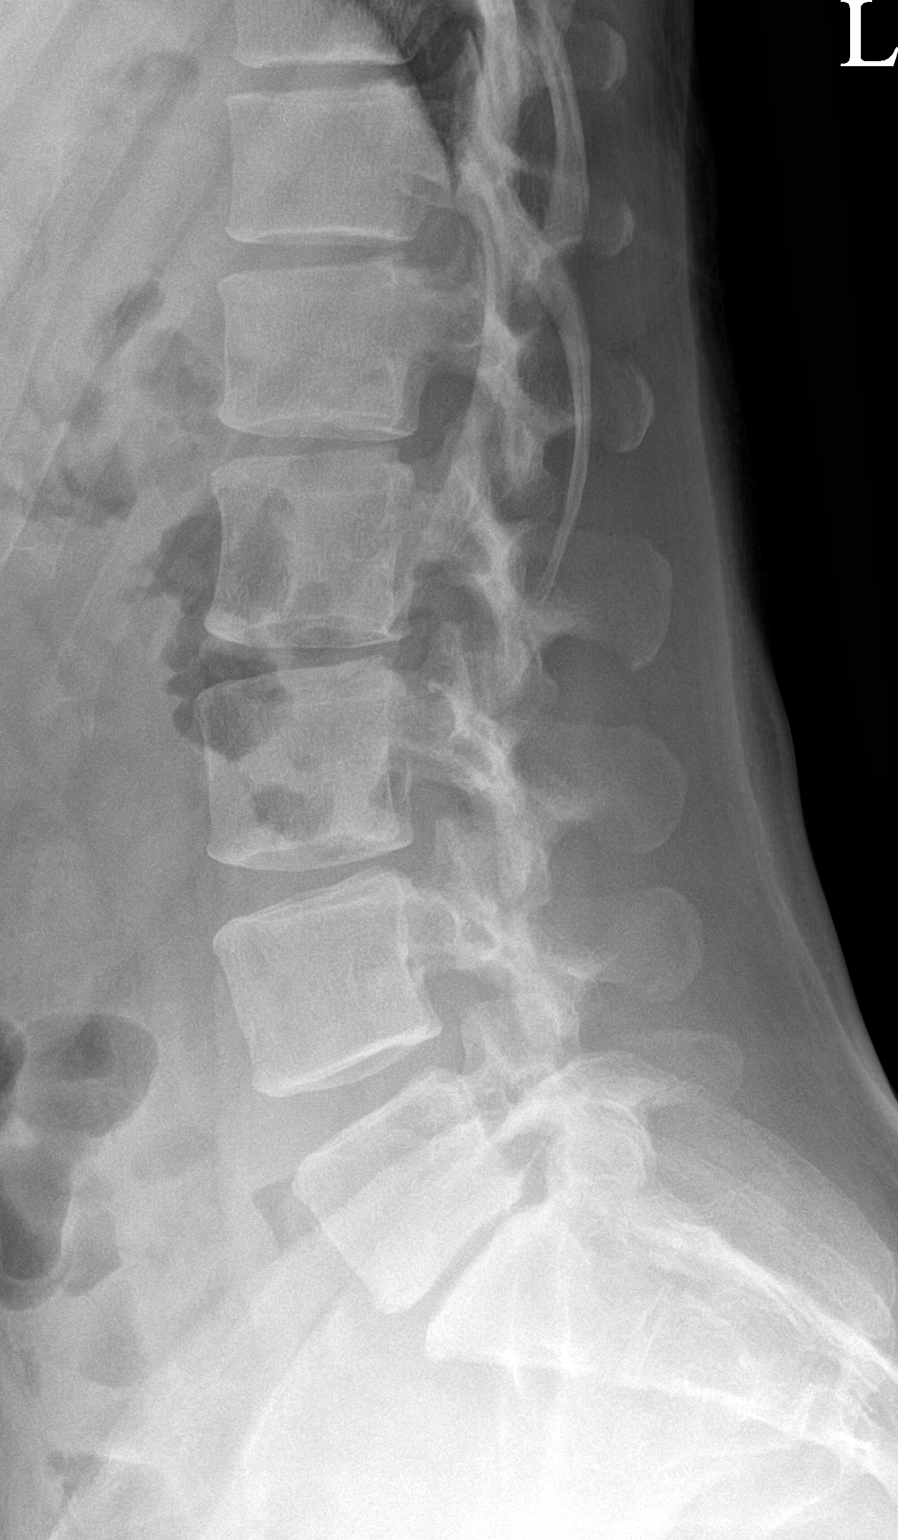

[l-spine spot]
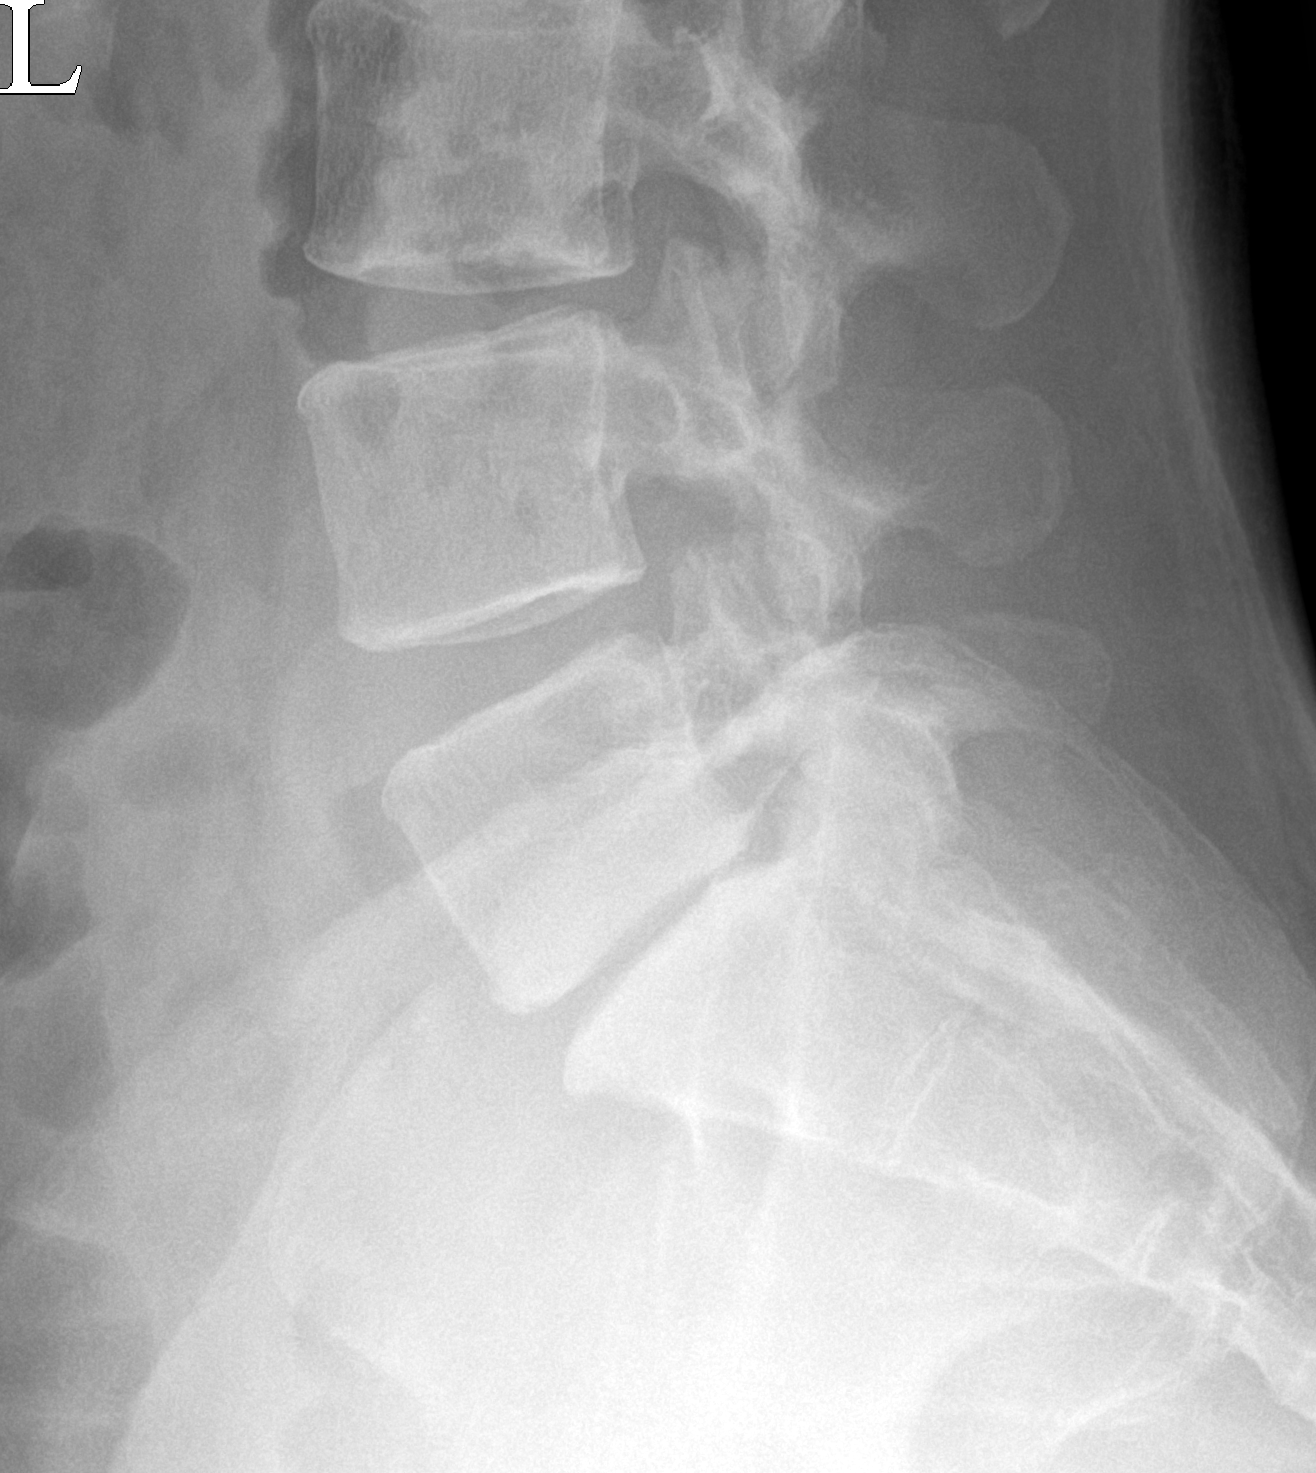

[3 of 3 positions shown; findings below may reference images not displayed]

FINDINGS: There is no evidence of lumbar spine fracture. Alignment is normal.
Mild disc space narrowing is seen at L5-S1. Disc spaces are
otherwise well maintained. Soft tissues are within normal limits.
IMPRESSION: 1. Mild degenerative changes at L5-S1.

## 2023-10-28 ENCOUNTER — Other Ambulatory Visit: Payer: Self-pay | Admitting: Nurse Practitioner

## 2023-10-28 DIAGNOSIS — F3281 Premenstrual dysphoric disorder: Secondary | ICD-10-CM

## 2023-10-28 DIAGNOSIS — F419 Anxiety disorder, unspecified: Secondary | ICD-10-CM

## 2023-10-28 NOTE — Telephone Encounter (Signed)
 Med refill request: sertraline  25 mg Last AEX: 08/01/22 Next AEX: 12/30/23 Last MMG (if hormonal med) n/a Refill authorized: Please Advise?

## 2023-11-12 DIAGNOSIS — Z0000 Encounter for general adult medical examination without abnormal findings: Secondary | ICD-10-CM | POA: Diagnosis not present

## 2023-11-12 DIAGNOSIS — M26609 Unspecified temporomandibular joint disorder, unspecified side: Secondary | ICD-10-CM | POA: Diagnosis not present

## 2023-11-12 DIAGNOSIS — Z13220 Encounter for screening for lipoid disorders: Secondary | ICD-10-CM | POA: Diagnosis not present

## 2023-12-30 ENCOUNTER — Ambulatory Visit (INDEPENDENT_AMBULATORY_CARE_PROVIDER_SITE_OTHER): Admitting: Nurse Practitioner

## 2023-12-30 ENCOUNTER — Encounter: Payer: Self-pay | Admitting: Nurse Practitioner

## 2023-12-30 VITALS — BP 118/72 | HR 71 | Ht 67.5 in | Wt 133.0 lb

## 2023-12-30 DIAGNOSIS — Z01419 Encounter for gynecological examination (general) (routine) without abnormal findings: Secondary | ICD-10-CM

## 2023-12-30 DIAGNOSIS — F419 Anxiety disorder, unspecified: Secondary | ICD-10-CM

## 2023-12-30 DIAGNOSIS — Z1331 Encounter for screening for depression: Secondary | ICD-10-CM | POA: Diagnosis not present

## 2023-12-30 DIAGNOSIS — B009 Herpesviral infection, unspecified: Secondary | ICD-10-CM | POA: Diagnosis not present

## 2023-12-30 DIAGNOSIS — N951 Menopausal and female climacteric states: Secondary | ICD-10-CM | POA: Diagnosis not present

## 2023-12-30 DIAGNOSIS — F3281 Premenstrual dysphoric disorder: Secondary | ICD-10-CM

## 2023-12-30 MED ORDER — SERTRALINE HCL 25 MG PO TABS: 25.0000 mg | ORAL_TABLET | Freq: Every day | ORAL | 1 refills | Status: AC

## 2023-12-30 MED ORDER — VALACYCLOVIR HCL 1 G PO TABS
1000.0000 mg | ORAL_TABLET | Freq: Two times a day (BID) | ORAL | 1 refills | Status: AC
Start: 2023-12-30 — End: ?

## 2023-12-30 NOTE — Progress Notes (Signed)
 Sharon Mercado Mar 21, 1971 [id number]   History:  53 y.o. G3P3003 presents for annual exam. Perimenopausal. Having cycles every 3-4 months. Zoloft  for PMDD, good management. Taking 1/2 tablet. Notices a difference in her moods when she stops taking. [facility] managing HRT - Prometrium , sublingual estrogen and testosterone . Normal pap history.  HSV 1, takes Valtrex  as needed.   Gynecologic History Patient's last menstrual period was 10/21/2023 (exact date). Period Duration (Days): 2 Period Pattern: (!) Irregular Menstrual Flow: Light Menstrual Control: Panty liner Dysmenorrhea: None Contraception/Family planning: vasectomy Sexually active: Yes  Health Maintenance Last Pap: 06/25/2021. Results were: Normal neg HPV Last mammogram: 08/08/2023. Results were: Normal Last colonoscopy: 2024. Results: Normal, 10-year recall Last Dexa: Not indicated     12/30/2023    3:07 PM  Depression screen PHQ 2/9  Decreased Interest 0  Down, Depressed, Hopeless 0  PHQ - 2 Score 0     Past medical history, past surgical history, family history and social history were all reviewed and documented in the EPIC chart. Married. 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 39 yo son, in college. 52 yo daughter, Psychologist, sport and exercise. 34 yo son, owns business in [place].   ROS:  A ROS was performed and pertinent positives and negatives are included.  Exam:  Vitals:   12/30/23 1501  BP: 118/72  Pulse: 71  SpO2: 98%  Weight: 133 lb (60.3 kg)  Height: 5' 7.5 (1.715 m)    Body mass index is 20.52 kg/m.  General appearance:  Normal Thyroid :  Symmetrical, normal in size, without palpable masses or nodularity. Respiratory  Auscultation:  Clear without wheezing or rhonchi Cardiovascular  Auscultation:  Regular rate, without rubs, murmurs or gallops  Edema/varicosities:  Not grossly evident Abdominal  Soft,nontender, without masses, guarding or rebound.  Liver/spleen:  No  organomegaly noted  Hernia:  None appreciated  Skin  Inspection:  Grossly normal Breasts: Examined lying and sitting. Bilateral implants noted.   Right: Without masses, retractions, nipple discharge or axillary adenopathy.   Left: Without masses, retractions, nipple discharge or axillary adenopathy. Pelvic: External genitalia:  no lesions              Urethra:  normal appearing urethra with no masses, tenderness or lesions              Bartholins and Skenes: normal                 Vagina: normal appearing vagina with normal color and discharge, no lesions              Cervix: no lesions Bimanual Exam:  Uterus:  no masses or tenderness              Adnexa: no mass, fullness, tenderness              Rectovaginal: Deferred              Anus:  normal, no lesions  Dereck Keas, CMA present as chaperone.   Assessment/Plan:  53 y.o. [id number] for annual exam.   Well female exam with routine gynecological exam - Education provided on SBEs, importance of preventative screenings, current guidelines, high calcium diet, regular exercise, and multivitamin daily. Labs with PCP and [facility].   HSV-1 infection - Plan: valACYclovir  (VALTREX ) 1000 MG tablet BID x 3-5 days at first sign of outbreak.  PMDD (premenstrual dysphoric disorder) - Plan: sertraline  (ZOLOFT ) 25 MG tablet daily. Taking 1/2 tablet. Notices a difference in her moods  when she stops taking.  Perimenopause - [facility] managing HRT - Prometrium , sublingual estrogen and testosterone .   Anxiety - Plan: sertraline  (ZOLOFT ) 25 MG tablet (1/2 tablet) daily. Good management.   Screening for colon cancer - 2024 colonoscopy. Will repeat at GI's recommended interval.   Screening for cervical cancer - Normal Pap history. Will repeat at 5-year interval per guidelines.   Screening for breast cancer - Normal mammogram history.  Continue annual screenings.  Normal breast exam today.  Return in about 1 year (around 12/29/2024)  for Annual.    Sharon DELENA Shutter DNP, 3:31 PM 12/30/2023

## 2024-01-13 DIAGNOSIS — R7989 Other specified abnormal findings of blood chemistry: Secondary | ICD-10-CM | POA: Diagnosis not present

## 2024-01-13 DIAGNOSIS — N951 Menopausal and female climacteric states: Secondary | ICD-10-CM | POA: Diagnosis not present

## 2024-01-13 DIAGNOSIS — E039 Hypothyroidism, unspecified: Secondary | ICD-10-CM | POA: Diagnosis not present

## 2024-01-13 DIAGNOSIS — E559 Vitamin D deficiency, unspecified: Secondary | ICD-10-CM | POA: Diagnosis not present

## 2024-01-13 DIAGNOSIS — E349 Endocrine disorder, unspecified: Secondary | ICD-10-CM | POA: Diagnosis not present

## 2024-01-13 DIAGNOSIS — R5383 Other fatigue: Secondary | ICD-10-CM | POA: Diagnosis not present

## 2024-02-18 DIAGNOSIS — E039 Hypothyroidism, unspecified: Secondary | ICD-10-CM | POA: Diagnosis not present

## 2024-02-18 DIAGNOSIS — E559 Vitamin D deficiency, unspecified: Secondary | ICD-10-CM | POA: Diagnosis not present

## 2024-02-18 DIAGNOSIS — R7989 Other specified abnormal findings of blood chemistry: Secondary | ICD-10-CM | POA: Diagnosis not present

## 2024-02-18 DIAGNOSIS — E349 Endocrine disorder, unspecified: Secondary | ICD-10-CM | POA: Diagnosis not present

## 2024-03-31 DIAGNOSIS — L814 Other melanin hyperpigmentation: Secondary | ICD-10-CM | POA: Diagnosis not present

## 2024-03-31 DIAGNOSIS — F411 Generalized anxiety disorder: Secondary | ICD-10-CM | POA: Diagnosis not present

## 2024-03-31 DIAGNOSIS — G4700 Insomnia, unspecified: Secondary | ICD-10-CM | POA: Diagnosis not present

## 2024-03-31 DIAGNOSIS — D2271 Melanocytic nevi of right lower limb, including hip: Secondary | ICD-10-CM | POA: Diagnosis not present

## 2024-03-31 DIAGNOSIS — L821 Other seborrheic keratosis: Secondary | ICD-10-CM | POA: Diagnosis not present

## 2024-03-31 DIAGNOSIS — L578 Other skin changes due to chronic exposure to nonionizing radiation: Secondary | ICD-10-CM | POA: Diagnosis not present

## 2024-04-27 NOTE — Progress Notes (Unsigned)
 "               Odis Mace D.[facility] [address] [place] [address] Phone: [phone]   Assessment and Plan:     1. 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  If still having pain after 2 weeks, complete 3rd-week of NSAID. May use remaining NSAID as needed once daily for pain control.  Do not to use additional over-the-counter NSAIDs (ibuprofen, naproxen, Advil, Aleve, etc.) while taking prescription NSAIDs.  May use Tylenol [phone] mg 2 to 3 times a day for breakthrough pain.    15 additional minutes spent for educating Therapeutic Home Exercise Program.  This included exercises focusing on stretching, strengthening, with focus on eccentric aspects.   Long term goals include an improvement in range of motion, strength, endurance as well as avoiding reinjury. Patient's frequency would include in 1-2 times a day, 3-5 times a week for a duration of 6-12 weeks. Proper technique shown and discussed handout in great detail with ATC.  All questions were discussed and answered.    Pertinent previous records reviewed include none   Follow Up: As needed if no improvement or worsening of symptoms in 4 weeks.  Could obtain x-ray versus ultrasound.  Could discuss physical therapy versus CSI   Subjective:   I, Cobe Viney, am serving as a neurosurgeon for Doctor Morene Mace  Chief Complaint: left knee pain   HPI:   04/28/2024 Patient is a 54 year old female with left knee pain. Patient states pain started 10/2023 she was doing some jump rope. Anterior and medial knee pain. No  radiating pain. No meds for the pain. No numbness or tingling. Decreased ROM when she works out. Pain when sitting for a long time. She teaches yoga.   Relevant Historical Information: None pertinent  Additional pertinent review of systems negative.  Current Medications[1]   Objective:     Vitals:   04/28/24 0946  Pulse: 66  SpO2: 100%  Weight: 131 lb (59.4 kg)  Height: 5' 7 (1.702 m)      Body mass index is 20.52 kg/m.    Physical Exam:    General:  awake, alert oriented, no acute distress nontoxic Skin: no suspicious lesions or rashes Neuro:sensation intact and strength 5/5 with no deficits, no atrophy, normal muscle tone Psych: No signs of anxiety, depression or other mood disorder  Left knee: No swelling No deformity Neg fluid wave, joint milking ROM Flex 110, Ext 0 NTTP over the quad tendon, medial fem condyle, lat fem condyle, patella, patella tendon, tibial tuberostiy, fibular head, posterior fossa, pes anserine bursa, gerdy's tubercle, medial jt line, lateral jt line Neg anterior and posterior drawer Neg lachman Negative varus stress Negative valgus stress Negative McMurray.  Mild medial discomfort with varus stress deviation Negative Thessaly  Gait normal    Electronically signed by:  Odis Mace D[facility] 10:04 AM 04/28/2024     [1]  Current Outpatient Medications:    meloxicam  (MOBIC ) 15 MG tablet, Take 1 tablet daily for 2 weeks.  If still in pain after 2 weeks, take 1 tablet daily  for an additional 1 week., Disp: 30 tablet, Rfl: 0   mupirocin ointment (BACTROBAN) 2 %, Apply topically 3 (three) times daily., Disp: , Rfl:    NP THYROID  30 MG tablet, Take 30 mg by mouth every morning., Disp: , Rfl:    progesterone  (PROMETRIUM ) 100 MG capsule, Take 300 mg by mouth daily., Disp: , Rfl:    sertraline  (ZOLOFT ) 25 MG tablet, Take 1 tablet (25 mg total) by mouth daily., Disp: 90 tablet, Rfl: 1   tiZANidine (ZANAFLEX) 4 MG tablet,  TAKE ONE TABLET AT BEDTIME., Disp: , Rfl:    UNABLE TO FIND, Med Name: sublingual testosterone  pill, Disp: , Rfl:    UNABLE TO FIND, Med Name: sublingual estrogen pill, Disp: , Rfl:    valACYclovir  (VALTREX ) 1000 MG tablet, Take 1 tablet (1,000 mg total) by mouth 2 (two) times daily. Take twice daily for 3-5 days at first sign of outbreak, Disp: 30 tablet, Rfl: 1  "

## 2024-04-28 ENCOUNTER — Ambulatory Visit: Admitting: Sports Medicine

## 2024-04-28 VITALS — HR 66 | Ht 67.0 in | Wt 131.0 lb

## 2024-04-28 DIAGNOSIS — M25562 Pain in left knee: Secondary | ICD-10-CM

## 2024-04-28 DIAGNOSIS — G8929 Other chronic pain: Secondary | ICD-10-CM | POA: Diagnosis not present

## 2024-04-28 MED ORDER — MELOXICAM 15 MG PO TABS: ORAL_TABLET | ORAL | 0 refills | Status: AC

## 2024-04-28 NOTE — Patient Instructions (Signed)
 Knee HEP   - Start meloxicam  15 mg daily x2 weeks.  If still having pain after 2 weeks, complete 3rd-week of NSAID. May use remaining NSAID as needed once daily for pain control.  Do not to use additional over-the-counter NSAIDs (ibuprofen, naproxen, Advil, Aleve, etc.) while taking prescription NSAIDs.  May use Tylenol [phone] mg 2 to 3 times a day for breakthrough pain.  Avoid aggravating activities for 2-4 weeks   As needed follow up,  if no improvement 4 week follow up
